# Patient Record
Sex: Female | Born: 1960 | Race: Black or African American | Hispanic: No | Marital: Single | Smoking: Current every day smoker
Health system: Southern US, Community
[De-identification: ages and names within clinical notes are randomized; demographics above are authoritative.]

## PROBLEM LIST (undated history)

## (undated) DIAGNOSIS — M5416 Radiculopathy, lumbar region: Secondary | ICD-10-CM

## (undated) DIAGNOSIS — R202 Paresthesia of skin: Secondary | ICD-10-CM

## (undated) DIAGNOSIS — M48061 Spinal stenosis, lumbar region without neurogenic claudication: Secondary | ICD-10-CM

## (undated) DIAGNOSIS — Z972 Presence of dental prosthetic device (complete) (partial): Secondary | ICD-10-CM

## (undated) DIAGNOSIS — Z716 Tobacco abuse counseling: Secondary | ICD-10-CM

## (undated) DIAGNOSIS — K5792 Diverticulitis of intestine, part unspecified, without perforation or abscess without bleeding: Secondary | ICD-10-CM

## (undated) DIAGNOSIS — K5732 Diverticulitis of large intestine without perforation or abscess without bleeding: Secondary | ICD-10-CM

## (undated) DIAGNOSIS — R200 Anesthesia of skin: Secondary | ICD-10-CM

## (undated) DIAGNOSIS — R739 Hyperglycemia, unspecified: Secondary | ICD-10-CM

## (undated) DIAGNOSIS — M1990 Unspecified osteoarthritis, unspecified site: Secondary | ICD-10-CM

## (undated) HISTORY — DX: Radiculopathy, lumbar region: M54.16

## (undated) HISTORY — DX: Tobacco abuse counseling: Z71.6

## (undated) HISTORY — DX: Paresthesia of skin: R20.2

## (undated) HISTORY — DX: Diverticulitis of large intestine without perforation or abscess without bleeding: K57.32

## (undated) HISTORY — DX: Spinal stenosis, lumbar region without neurogenic claudication: M48.061

## (undated) HISTORY — DX: Anesthesia of skin: R20.0

## (undated) HISTORY — DX: Hyperglycemia, unspecified: R73.9

---
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
## 1999-11-13 ENCOUNTER — Other Ambulatory Visit: Admission: RE | Admit: 1999-11-13 | Discharge: 1999-11-13 | Payer: Self-pay | Admitting: Obstetrics and Gynecology

## 2000-03-26 ENCOUNTER — Ambulatory Visit (HOSPITAL_COMMUNITY): Admission: RE | Admit: 2000-03-26 | Discharge: 2000-03-26 | Payer: Self-pay | Admitting: Obstetrics and Gynecology

## 2000-03-26 ENCOUNTER — Encounter: Payer: Self-pay | Admitting: Obstetrics and Gynecology

## 2000-04-08 ENCOUNTER — Encounter: Payer: Self-pay | Admitting: Obstetrics and Gynecology

## 2000-04-09 ENCOUNTER — Ambulatory Visit (HOSPITAL_COMMUNITY): Admission: RE | Admit: 2000-04-09 | Discharge: 2000-04-09 | Payer: Self-pay | Admitting: Obstetrics and Gynecology

## 2000-04-09 ENCOUNTER — Encounter (INDEPENDENT_AMBULATORY_CARE_PROVIDER_SITE_OTHER): Payer: Self-pay | Admitting: Specialist

## 2000-12-03 ENCOUNTER — Other Ambulatory Visit: Admission: RE | Admit: 2000-12-03 | Discharge: 2000-12-03 | Payer: Self-pay | Admitting: Obstetrics and Gynecology

## 2001-06-29 HISTORY — PX: ABLATION ON ENDOMETRIOSIS: SHX5787

## 2004-09-08 ENCOUNTER — Ambulatory Visit: Payer: Self-pay | Admitting: *Deleted

## 2004-09-08 ENCOUNTER — Observation Stay (HOSPITAL_COMMUNITY): Admission: AD | Admit: 2004-09-08 | Discharge: 2004-09-08 | Payer: Self-pay | Admitting: Internal Medicine

## 2007-10-06 ENCOUNTER — Emergency Department (HOSPITAL_COMMUNITY): Admission: EM | Admit: 2007-10-06 | Discharge: 2007-10-06 | Payer: Self-pay | Admitting: Emergency Medicine

## 2009-10-25 ENCOUNTER — Emergency Department (HOSPITAL_COMMUNITY): Admission: EM | Admit: 2009-10-25 | Discharge: 2009-10-26 | Payer: Self-pay | Admitting: Emergency Medicine

## 2010-09-16 LAB — POCT I-STAT, CHEM 8
BUN: 12 mg/dL (ref 6–23)
Calcium, Ion: 1.07 mmol/L — ABNORMAL LOW (ref 1.12–1.32)
Creatinine, Ser: 0.4 mg/dL (ref 0.4–1.2)
Glucose, Bld: 125 mg/dL — ABNORMAL HIGH (ref 70–99)
TCO2: 21 mmol/L (ref 0–100)

## 2010-09-16 LAB — CBC
HCT: 39.3 % (ref 36.0–46.0)
MCV: 77.1 fL — ABNORMAL LOW (ref 78.0–100.0)
RBC: 5.09 MIL/uL (ref 3.87–5.11)
WBC: 7.6 10*3/uL (ref 4.0–10.5)

## 2010-11-14 NOTE — Op Note (Signed)
[facility]  Patient:    Taylor Ray, Taylor Ray                   MRN: [id number] Proc. Date: 04/09/00 Adm. Date:  [id number] Attending:  Conley Simmonds A CC:         Brook A. Edward Jolly, M.D.   Operative Report  PREOPERATIVE DIAGNOSIS:  Anal condylomata.  POSTOPERATIVE DIAGNOSIS:  Anal condylomata.  PROCEDURE:  Laser and cautery destruction, anal condylomata.  SURGEON:  Timothy E. Earlene Plater, M.D.  ANESTHESIA:  General.  INDICATIONS:  This 50 year old black female was referred to me by Dr. Conley Simmonds who I was glad to see.  She had fairly extensive anal condylomata that had been present for some time and after careful discussion, she wished to have these treated at the time of her proposed laparoscopic surgery.  We have made those arrangements, and the patient is ready to proceed.  DESCRIPTION OF PROCEDURE:  The patient had been operated on by Dr. Edward Jolly. Please see her separately dictated note.  She was stable when I entered the room.  She was in lithotomy position.  The drapes were rearranged, and the feet were elevated so that the perianal area could be carefully visualized. Wet drapes were hung around the operative site.  The condylomata were extensive, extending out from the anal verge to the perianal skin.  The larger pedunculated lesions were removed by cautery and submitted for pathology.  The smaller lesions and the bases of all lesions were treated with the CO2 laser between 5 and 10 watts continuous power.  These were completely treated. There were no complications.  Dry sterile dressing was applied.  She tolerated it well and was removed to the recovery room in good condition.  Written instructions were given, and she will be followed as an outpatient. DD:  04/09/00 TD:  04/11/00 Job: [id number]

## 2010-11-14 NOTE — Discharge Summary (Signed)
Taylor Ray, Taylor Ray NO.:  [id number]   MEDICAL RECORD NO.:  [id number]          PATIENT TYPE:  INP   LOCATION:  4733                         FACILITY:  [facility]   PHYSICIAN:  Duke Salvia, M.D.  DATE OF BIRTH:  08-20-1960   DATE OF ADMISSION:  09/08/2004  DATE OF DISCHARGE:  09/08/2004                                 DISCHARGE SUMMARY   PROCEDURES:  1.  Cardiac catheterization.  2.  Coronary arteriogram.  3.  Left ventriculogram.   DISCHARGE DIAGNOSES:  1.  Chest pain, Cardiolite with anterior wall ischemia.  2.  Morbid obesity.  3.  Hyperlipidemia.  4.  History of tobacco use, quit one week ago.  5.  HISTORY OF ALLERGY OR INTOLERANCE TO PENICILLIN, HYDROCODONE AND      BENADRYL.  6.  Chronic pelvic pain secondary reportedly to ovarian cysts.  7.  Status post tonsillectomy.   HOSPITAL COURSE:  [name] is a 51 year old female without known coronary  artery disease.  She went to [facility] for chest pain and ruled out  there for an MI.  She was seen there by Dr. Sherril Croon and had an adenosine  Cardiolite.  The Cardiolite was positive for anterior wall ischemia.  She  was transferred to [facility] for further evaluation and  catheterization.   The cardiac catheterization showed no coronary artery disease.  Her EF was  60% with no wall motion abnormality and no MR.  Post cath she had no chest  pain or shortness of breath with ambulation.  Her vital signs were stable.  She was considered stable for discharge on September 08, 2004, and is to follow-  up as an outpatient with her primary care physician and with cardiology as  needed.   DISCHARGE MEDICATIONS:  1.  Lodine 600 mg daily p.r.n.  2.  Aleve p.r.n.  3.  Lipitor 40 mg daily.      RB/MEDQ  D:  09/08/2004  T:  09/08/2004  Job:  [id number]   cc:   Carole Binning, M.D. [facility]   Abner Greenspan, MD   Dhruv Vyas  [address]  [place]  [place] [address]  Fax: [phone]

## 2010-11-14 NOTE — Cardiovascular Report (Signed)
NAME:  Taylor Ray, Taylor Ray NO.:  [id number]   MEDICAL RECORD NO.:  [id number]          PATIENT TYPE:  INP   LOCATION:  4733                         FACILITY:  [facility]   PHYSICIAN:  Carole Binning, M.D. LHCDATE OF BIRTH:  1961-04-03   DATE OF PROCEDURE:  09/08/2004  DATE OF DISCHARGE:                              CARDIAC CATHETERIZATION   PROCEDURE PERFORMED:  Left heart catheterization with coronary angiography  and left ventriculography.   INDICATIONS:  [name] is a 50 year old woman with cardiac risk factors of  obesity and hyperlipidemia.  She was admitted to [facility] with  chest pain where she ruled out for myocardial infarction.  A stress nuclear  study suggested anterior ischemia.  The patient was transferred to [facility] for cardiac catheterization.   PROCEDURAL NOTE:  A 6-French sheath was placed in the right femoral artery.  Coronary angiography was performed with standard Judkins 6-French catheters.  Left ventriculography was performed with an angled pigtail catheter.  Contrast was Omnipaque.  At the conclusion of the procedure a 6-French  AngioSeal vascular closure device was placed in the right femoral artery  with good hemostasis.  There were no complications.   RESULTS:  HEMODYNAMICS:  Left ventricular pressure 114/20.  Aortic pressure 114/78.  There is no  aortic valve gradient.   LEFT VENTRICULOGRAM:  Wall motion is normal.  Ejection fraction estimated at 60%.  There is trace  mitral regurgitation.   CORONARY ARTERIOGRAPHY:  Left main is normal.   Left anterior descending artery has a 20% stenosis in the ostium.  The LAD  is normal with only minor luminal irregularities.  It gives rise to two  small diagonal branches.   Left circumflex gives rise to a small first obtuse marginal, large branching  second obtuse marginal, and four small posterolateral branches.  The left  circumflex is normal.   Right coronary artery is a  relatively small, somewhat codominant vessel.  It  gives rise to a normal sized posterior descending artery and a small  posterolateral branch.   IMPRESSION:  1.  Normal left ventricular systolic function.  2.  No significant coronary artery disease identified.   PLAN:  Patient will be managed medically.  Alternative etiologies for the  chest pain should be considered.      MWP/MEDQ  D:  09/08/2004  T:  09/08/2004  Job:  [id number]   cc:   Florian Buff, M.D.   Abner Greenspan, M.D.   Cath Lab

## 2010-11-14 NOTE — Op Note (Signed)
[facility]  Patient:    Taylor Ray, Taylor Ray                   MRN: [id number] Proc. Date: 04/09/00 Adm. Date:  [id number] Disc. Date: [id number] Attending:  Conley Simmonds A                           Operative Report  PREOPERATIVE DIAGNOSIS:  Chronic pelvic pain.  POSTOPERATIVE DIAGNOSIS:  Chronic pelvic pain, extensive pelvic adhesions.  PROCEDURE:  Diagnostic laparoscopy with lysis of adhesions and cul-de-sac peritoneal biopsy.  IV FLUIDS:  Ringers lactate, 1900 cc.  ESTIMATED BLOOD LOSS:  100 cc.  URINE OUTPUT:  100 cc.  COMPLICATIONS:  None.  INDICATIONS FOR PROCEDURE:  The patient was a 50 year old gravida 0 African-American female with a history of chronic pelvic pain of eight months duration.  The patient reported pain between the vagina and the rectum and also located in the left lower quadrant.  The patient was treated for pelvic inflammatory disease on two occasions, and her gonorrhea and chlamydia cultures were negative.  The patient reports that oral contraceptives did somewhat diminish her pelvic pain, but the contraceptives were discontinued secondary to her use of tobacco.  Nonsteroidal anti-inflammatory medications and Micronor were somewhat helpful in treating the pain.  The patient was status post colonoscopy and an upper endoscopy in 1999 which were both normal. The patient had a pelvic ultrasound on March 04, 2000, at which time, the 2-cm simple cyst was noted of each ovary.  The uterus was unremarkable.  The patient also underwent a preoperative hysterosalpingogram which documented a normal fill and contrast of the uterine cavity along with normal fill-and-spill of the fallopian tubes bilaterally.  The patient wished for surgical evaluation and treatment of her chronic pelvic pain, and she agreed to her procedure after the risks, benefits, and alternatives were reviewed with her.  FINDINGS:  Laparoscopy demonstrated  extensive thickened adhesions between the omentum and the left adnexa and thickened omental adhesions to the right adnexa.  The omentum was also adherent to the posterior cul-de-sac and completely obliterated it.  There was evidence of a right corpus luteum cyst, and the ovaries were noted to be normal.  The fallopian tubes appeared to be clubbed bilaterally.  There was no evidence of any obvious endometriosis in the pelvis.  The uterus was noted to be normal as was the appendix and the liver and stomach.  There was no evidence of any upper abdominal adhesions.  SPECIMENS:  A biopsy was taken from the posterior cul-de-sac.  DESCRIPTION OF PROCEDURE:  With an IV in place, the patient was taken to the operating room after she was properly identified.  The patient was given clindamycin 900 mg intravenously for antibiotic prophylaxis.  The patient was placed in the supine position, and general endotracheal anesthesia was induced.  The patient was then placed in the dorsolithotomy position, and her abdomen and vagina were sterilely prepped and draped.  A Foley catheter was sterilely placed inside the bladder, and a Hulka tenaculum was placed on the anterior cervical lips.  The procedure began with an umbilical incision which was created sharply with a scalpel.  This was carried down to the fascia with blunt dissection.  A 10-mm trocar was then inserted directly into the abdominal cavity, and the laparoscope confirmed proper placement.  A pneumoperitoneum was achieved.  The patient was then placed in the Trendelenburg position.  A 5-mm  trocar was placed beneath the right and left lower quadrants under direct visualization of the laparoscope.  There was some bleeding noted along the right lower quadrant incision prior to placement of the trocar; this was cauterized with monopolar cautery.  There was no evidence of any bleeding from the trocar site noted intraperitoneally.  An exploration of  the abdomen and pelvis was performed.  The findings were noted above.  The procedure began with bipolar cautery to the omental adhesion to the left adnexa.  The cautery was performed well away from the fallopian tube.  The omentum was dissected away from the adnexa using a combination of sharp and blunt dissection.  The omentum was freed 100% from the adnexa on this side.  The same procedure that was performed on the patients left-hand side was then repeated along the right adnexal region.  Again, care was taken to avoid contact with the fallopian tube during the cauterization process.  The omental adhesions were then dissected free from the posterior cul-de-sac. The omentum achieved 100% mobility from this region.  A peritoneal biopsy was then performed in the posterior cul-de-sac using sharp dissection with a scissors.  The edges of the peritoneum and the underlying fat were cauterized with monopolar cautery for excellent hemostasis.  The abdomen was then irrigated and suctioned with crystalloid solution, and the operative sites were re-examined.  There was some additional bleeding noted along the omentum which responded well to bipolar cautery.  The pneumoperitoneum was then partially released, and the operative sites were examined.  There was no evidence of any ongoing bleeding noted.  Intercede was then introduced into the peritoneal cavity and wrapped around the adnexae bilaterally.  The lower abdominal trocars were then removed under visualization of the laparoscope, and the pneumoperitoneum was released.  The laparoscope and the 10-mm umbilical trocar were removed simultaneously.  The skin incisions were closed with subcuticular sutures of 4-0 Vicryl.  The patients abdomen was cleansed with remaining Betadine, and Steri-Strips and bandages were placed over the incisions.  The Hulka tenaculum was then removed from the vagina.  At this point, the surgery was turned over to Dr.  Lorelee New who performed laser ablation of perirectal condylomata.  Please refer to that dictation separately. DD:  04/09/00 TD:  04/11/00 Job: [id number]

## 2010-11-14 NOTE — H&P (Signed)
Taylor Ray, Taylor Ray NO.:  [id number]   MEDICAL RECORD NO.:  [id number]          PATIENT TYPE:  INP   LOCATION:  4733                         FACILITY:  [facility]   PHYSICIAN:  Verne Grain, MD   DATE OF BIRTH:  [birth year]   DATE OF ADMISSION:  09/08/2004  DATE OF DISCHARGE:                                HISTORY & PHYSICAL   PRIMARY CARDIOLOGIST:  None.   PRIMARY CARE PHYSICIAN:  Dr. Abner Greenspan at [facility] in  [place]   CHIEF COMPLAINT:  Chest pain with positive adenosine Cardiolite (anterior  wall ischemia).  Transferred from [facility] for cardiac  catheterization.   HISTORY OF PRESENT ILLNESS:  A 50 year old female morbidly obese,  hyperlipidemia, tobacco (quit last week).  Noted occasional chest pain in  the evenings lasting one minute or less over the preceding several months.  No clear aggravating, relieving, or associated factors.  Also would  occasionally have chest tightness with ambulation.  However, this was not  reproducible and was only present on a couple of occasions with symptoms  again lasting a minute or less.  However, on the date of September 04, 2004  patient woke up 3 a.m. with a 7/10 chest pain in the upper chest region with  no radiation, no nausea, no vomiting, no diaphoresis, no shortness of  breath.  The discomfort waxed and waned from 12 hours going from a 7/10  severity to 0/10 and back to a 7/10.  She presented to the emergency room  with serial cardiac markers that were negative.  She had a two-day protocol  adenosine Cardiolite which revealed evidence of anterior ischemia.  The  patient was subsequently sent to [facility] for cardiac catheterization as  follow-up to this abnormal nuclear perfusion study.  Patient has had no  chest pain or shortness of breath since her initial test.   PAST MEDICAL HISTORY:  1.  Hyperlipidemia.  2.  Morbid obesity.  3.  Tobacco use (quit one week ago).  4.  History of  chronic pelvic pain/ovarian cysts.   ALLERGIES/ADVERSE REACTIONS:  PENICILLIN had some reaction in childhood for  which the patient could not recall, HYDROCODONE causes a rash, BENADRYL  causes agitation.   HOME MEDICATIONS:  1.  Lodine XL 600 mg p.r.n.  2.  Microgestin Fe daily.   [place] TRANSFER MEDICATIONS:  1.  Aspirin 81 mg p.o. daily.  2.  Protonix 40 mg p.o. daily.  3.  Lovenox 140 mg subcutaneous q.12h.  4.  Lipitor 40 mg p.o. q.h.s.  5.  Metoprolol 25 mg p.o. b.i.d.  6.  p.r.n.'s.   SOCIAL HISTORY:  The patient lives in [place] alone.  She is a CNA/med  tech.  She has no children.  She is not married.  She had smoked  approximately one pack of cigarettes every three days, but reports quitting  last Monday.  She drinks no alcohol on any regular basis and denies any  illicit drug use.  She has no regular exercise regimen, but reports being  active doing housework and in her job.   FAMILY HISTORY:  The patient's mother is alive at age 79 with hypertension,  hyperlipidemia, and knee replacement.  Patient's father is alive at age 51  with hypertension.  Patient has one sister with diabetes.  Three brothers,  two of whom are healthy, one of whom died of sickle cell disease.   REVIEW OF SYSTEMS:  No fevers, chills, sweats, cough.  No acute auditory or  visual changes.  No acute rash.  The patient does report chest pain as  described above, but denies any shortness of breath, dyspnea on exertion,  orthopnea, PND, edema, palpitations, presyncope, claudication, cough, or  wheezing.  Frequency.  The patient has no bowel or bladder complaints.  No  focal weakness or numbness.  She did have some itching after her Cardiolite  examination but that has spontaneously resolved.  The etiology of itching  was unclear.  She reports no nausea, vomiting, diarrhea.  No polyuria,  polydipsia.  No heat or cold intolerance.  No hair changes or other  abnormality suggestive of endocrinologic  derangement.   PHYSICAL EXAMINATION:  VITAL SIGNS:  Temperature 97.8, heart rate 83,  respiratory rate 20, blood pressure 124/83, weight 307.3 pounds.  GENERAL:  Patient is pleasant, cooperative, morbidly obese.  HEENT:  Normocephalic, atraumatic.  Extraocular movements are intact.  Oropharynx is pink and moist without lesions.  NECK:  Supple.  There is no jugular venous distention.  There are no carotid  bruits.  CARDIOVASCULAR:  Regular S1 and regular S2.  Lung fields are clear to  auscultation bilaterally.  There is no acute rash identified.  ABDOMEN:  Obese, soft, nontender, nondistended with positive bowel sounds.  EXTREMITIES:  No evidence of edema.  No femoral bruits.  Patient is alert  and oriented.  She moves all four extremities without difficulty and has a  grossly nonfocal neurological examination.   [place] chest x-ray reported as no acute abnormality.   EKG:  Sinus rhythm with normal PR, QRS, and QTC intervals.  Axis is leftward  with poor R-wave progression and low anterior forces possibly related to  lead placement versus other etiology.   LABORATORIES:  White blood cell count 6.7, hematocrit 37, platelets 303.  Sodium 142, potassium 4.5, chloride 109, bicarbonate 25, anion gap 8, BUN  12, creatinine 0.9, glucose 92, total bilirubin 0.54, AST 27, ALT 23,  alkaline phosphatase 108.  CK 159, CK-MB 1.1.  Albumin 3.8, total protein  7.9.  Troponin 0.00.  Total cholesterol 233, LDL 156, triglycerides 79.  Serial troponin and CK-MB negative x3.  Calcium 9.2.   ASSESSMENT/PLAN:  A 50 year old female morbid obesity, hyperlipidemia,  tobacco (quit cigarettes last week) with chest pain, mostly atypical  symptoms.  However, adenosine sestamibi performed at [facility] was  abnormal, reportedly revealing evidence of anterior ischemia prompting  transfer to [facility] for follow-up catheterization. 1.  Chest pain with abnormal nuclear perfusion imaging study.  Will make  the      patient n.p.o. except for medications in preparation for cardiac      catheterization.  Will hold Lovenox in preparation for heart      catheterization.  Will continue aspirin, metoprolol, Lipitor.  Will      continue to encourage tobacco cessation and weight loss throughout this      hospitalization.  2.  Hyperlipidemia.  Patient's LDL was 156 at [facility].  She is      currently being treated with Lipitor 40 mg p.o. q.h.s. with LFTs that      are  normal at baseline.  3.  Tobacco.  I will continue to encourage the importance of cessation.  4.  Morbid obesity.  Will check a hemoglobin A1C.  It is notable that the      patient has positive family history of diabetes mellitus.  Therefore,      she is clearly at risk with her increased weight and positive family      history of developing insulin resistance.  Following discharge will      strongly encourage regular exercise regimen, diet, weight loss.  Will      also check a TSH and free T4 to look for other contributing factors to      patient's morbid obesity.  5.  Gastrointestinal.  It is certainly possible, with all the patient's      symptoms at night upper chest pain that she could have gastric reflux.      We will treat her empirically with a proton-pump inhibitor therapy while      she awaits cardiac catheterization therapy to exclude coronary artery      disease.      DDH/MEDQ  D:  09/08/2004  T:  09/08/2004  Job:  [id number]

## 2011-03-24 LAB — POCT I-STAT, CHEM 8
Calcium, Ion: 1.15
Chloride: 110
Glucose, Bld: 94
HCT: 39

## 2011-03-24 LAB — DIFFERENTIAL
Basophils Relative: 0
Eosinophils Absolute: 0.1
Monocytes Absolute: 0.4
Monocytes Relative: 7
Neutro Abs: 4

## 2011-03-24 LAB — POCT CARDIAC MARKERS: CKMB, poc: 1.6

## 2011-03-24 LAB — CBC
Hemoglobin: 12.1
MCHC: 32.7
MCV: 76.1 — ABNORMAL LOW
RBC: 4.86

## 2016-09-25 ENCOUNTER — Emergency Department
Admission: EM | Admit: 2016-09-25 | Discharge: 2016-09-25 | Disposition: A | Discharge status: Home / Self Care | Payer: Self-pay | Attending: Emergency Medicine | Admitting: Emergency Medicine

## 2016-09-25 DIAGNOSIS — Z87891 Personal history of nicotine dependence: Secondary | ICD-10-CM | POA: Insufficient documentation

## 2016-09-25 DIAGNOSIS — Z5321 Procedure and treatment not carried out due to patient leaving prior to being seen by health care provider: Secondary | ICD-10-CM | POA: Insufficient documentation

## 2016-09-25 DIAGNOSIS — K5732 Diverticulitis of large intestine without perforation or abscess without bleeding: Secondary | ICD-10-CM | POA: Insufficient documentation

## 2016-09-25 DIAGNOSIS — R109 Unspecified abdominal pain: Secondary | ICD-10-CM | POA: Insufficient documentation

## 2016-09-25 LAB — COMPREHENSIVE METABOLIC PANEL
ALT: 11 U/L — ABNORMAL LOW (ref 14–54)
ANION GAP: 7 (ref 5–15)
AST: 16 U/L (ref 15–41)
Albumin: 3.7 g/dL (ref 3.5–5.0)
Alkaline Phosphatase: 89 U/L (ref 38–126)
BUN: 12 mg/dL (ref 6–20)
CHLORIDE: 109 mmol/L (ref 101–111)
CO2: 25 mmol/L (ref 22–32)
Calcium: 8.9 mg/dL (ref 8.9–10.3)
Creatinine, Ser: 0.55 mg/dL (ref 0.44–1.00)
Glucose, Bld: 101 mg/dL — ABNORMAL HIGH (ref 65–99)
Potassium: 3.7 mmol/L (ref 3.5–5.1)
SODIUM: 141 mmol/L (ref 135–145)
Total Bilirubin: 0.8 mg/dL (ref 0.3–1.2)
Total Protein: 6.9 g/dL (ref 6.5–8.1)

## 2016-09-25 LAB — CBC
HCT: 36.4 % (ref 35.0–47.0)
HEMOGLOBIN: 12 g/dL (ref 12.0–16.0)
MCH: 25.2 pg — ABNORMAL LOW (ref 26.0–34.0)
MCHC: 32.9 g/dL (ref 32.0–36.0)
MCV: 76.5 fL — AB (ref 80.0–100.0)
Platelets: 213 10*3/uL (ref 150–440)
RBC: 4.76 MIL/uL (ref 3.80–5.20)
RDW: 16.3 % — ABNORMAL HIGH (ref 11.5–14.5)
WBC: 7.4 10*3/uL (ref 3.6–11.0)

## 2016-09-25 LAB — URINALYSIS, COMPLETE (UACMP) WITH MICROSCOPIC
BILIRUBIN URINE: NEGATIVE
Bacteria, UA: NONE SEEN
GLUCOSE, UA: NEGATIVE mg/dL
Hgb urine dipstick: NEGATIVE
KETONES UR: NEGATIVE mg/dL
LEUKOCYTES UA: NEGATIVE
Nitrite: NEGATIVE
PH: 5 (ref 5.0–8.0)
PROTEIN: NEGATIVE mg/dL
Specific Gravity, Urine: 1.027 (ref 1.005–1.030)

## 2016-09-25 LAB — LIPASE, BLOOD: LIPASE: 19 U/L (ref 11–51)

## 2016-09-25 MED ORDER — ACETAMINOPHEN 325 MG PO TABS
ORAL_TABLET | ORAL | Status: AC
  Filled 2016-09-25: qty 2

## 2016-09-25 MED ORDER — ACETAMINOPHEN 325 MG PO TABS
650.0000 mg | ORAL_TABLET | Freq: Once | ORAL | Status: AC
  Administered 2016-09-25: 650 mg via ORAL

## 2016-09-25 MED ORDER — CIPROFLOXACIN HCL 500 MG PO TABS
ORAL_TABLET | ORAL | Status: AC
  Filled 2016-09-25: qty 1

## 2016-09-25 MED ORDER — METRONIDAZOLE 500 MG PO TABS
500.0000 mg | ORAL_TABLET | Freq: Once | ORAL | Status: AC
  Administered 2016-09-25: 500 mg via ORAL

## 2016-09-25 MED ORDER — METRONIDAZOLE 500 MG PO TABS: 500.0000 mg | ORAL_TABLET | Freq: Two times a day (BID) | ORAL | 0 refills | Status: DC

## 2016-09-25 MED ORDER — METRONIDAZOLE 500 MG PO TABS
ORAL_TABLET | ORAL | Status: AC
  Filled 2016-09-25: qty 1

## 2016-09-25 MED ORDER — CIPROFLOXACIN HCL 500 MG PO TABS
500.0000 mg | ORAL_TABLET | Freq: Once | ORAL | Status: AC
  Administered 2016-09-25: 500 mg via ORAL

## 2016-09-25 MED ORDER — CIPROFLOXACIN HCL 500 MG PO TABS: 500.0000 mg | ORAL_TABLET | Freq: Two times a day (BID) | ORAL | 0 refills | Status: DC

## 2016-09-25 MED ORDER — TRAMADOL HCL 50 MG PO TABS: 50.0000 mg | ORAL_TABLET | Freq: Four times a day (QID) | ORAL | 0 refills | Status: DC | PRN

## 2016-09-25 NOTE — ED Triage Notes (Addendum)
Pt reports to ED w/ c/o abd pain x 7 days. Sts n/v started today.  +PO fluid intake. Resp even and unlabored, skin warm and dry, ambulatory to triage w/o issue

## 2016-09-25 NOTE — ED Triage Notes (Signed)
Pt had left ED to "pick up client", had to be reregistered.  Pt complaints remains abd pain x 7 days w/ nv that began today.  +PO fluid intake.  NAD

## 2016-09-25 NOTE — ED Provider Notes (Signed)
[facility] Emergency Department Provider Note   ____________________________________________    I have reviewed the triage vital signs and the nursing notes.   HISTORY  Chief Complaint Abdominal Pain     HPI Taylor Ray is a 56 y.o. female who presents with complaints of abdominal pain. Patient describes left lower quadrant abdominal pain over the last week which has been "crampy" in nature and mild to moderate. She has had mild nausea but no vomiting, no diarrhea, normal stools. She does have a history of diverticulitis and this feels similar. No fevers or chills. No dysuria   History reviewed. No pertinent past medical history.  There are no active problems to display for this patient.   Past Surgical History:  Procedure Laterality Date  . ABLATION ON ENDOMETRIOSIS    . TONSILLECTOMY      Prior to Admission medications   Medication Sig Start Date End Date Taking? Authorizing Provider  ciprofloxacin (CIPRO) 500 MG tablet Take 1 tablet (500 mg total) by mouth 2 (two) times daily. 09/25/16   Lavonia Drafts, MD  metroNIDAZOLE (FLAGYL) 500 MG tablet Take 1 tablet (500 mg total) by mouth 2 (two) times daily after a meal. 09/25/16   Lavonia Drafts, MD  traMADol (ULTRAM) 50 MG tablet Take 1 tablet (50 mg total) by mouth every 6 (six) hours as needed. 09/25/16 09/25/17  Lavonia Drafts, MD     Allergies Penicillins and Benadryl [diphenhydramine hcl]  No family history on file.  Social History Social History  Substance Use Topics  . Smoking status: Former Research scientist (life sciences)  . Smokeless tobacco: Never Used  . Alcohol use Not on file    Review of Systems  Constitutional: No fever/chills Eyes: No visual changes.   Cardiovascular: Denies chest pain. Respiratory: Denies shortness of breath. Gastrointestinal: As above Genitourinary: Negative for dysuria. Musculoskeletal: Negative for back pain. Skin: Negative for rash.   10-point ROS otherwise  negative.  ____________________________________________   PHYSICAL EXAM:  VITAL SIGNS: ED Triage Vitals  Enc Vitals Group     BP 09/25/16 2008 130/75     Pulse Rate 09/25/16 2008 70     Resp 09/25/16 2008 18     Temp 09/25/16 2008 98.3 F (36.8 C)     Temp Source 09/25/16 2008 Oral     SpO2 09/25/16 2008 98 %     Weight 09/25/16 2007 258 lb (117 kg)     Height 09/25/16 2007 5\' 4"  (1.626 m)     Head Circumference --      Peak Flow --      Pain Score 09/25/16 2007 7     Pain Loc --      Pain Edu? --      Excl. in [place]? --     Constitutional: Alert and oriented. No acute distress. Pleasant and interactive  Nose: No congestion/rhinnorhea. Mouth/Throat: Mucous membranes are moist.    Cardiovascular: Normal rate, regular rhythm. Grossly normal heart sounds.  Good peripheral circulation. Respiratory: Normal respiratory effort.  No retractions. Lungs CTAB. Gastrointestinal: Mild tenderness to palpation left lower quadrant. No distention.  No CVA tenderness. Genitourinary: deferred Musculoskeletal: No lower extremity tenderness nor edema.  Warm and well perfused Neurologic:  Normal speech and language. No gross focal neurologic deficits are appreciated.  Skin:  Skin is warm, dry and intact. No rash noted. Psychiatric: Mood and affect are normal. Speech and behavior are normal.  ____________________________________________   LABS (all labs ordered are listed, but only abnormal results  are displayed)  Labs Reviewed - No data to display ____________________________________________  EKG  None ____________________________________________  RADIOLOGY  None ____________________________________________   PROCEDURES  Procedure(s) performed: No    Critical Care performedNo ____________________________________________   INITIAL IMPRESSION / ASSESSMENT AND PLAN / ED COURSE  Pertinent labs & imaging results that were available during my care of the patient were reviewed  by me and considered in my medical decision making (see chart for details).  Patient overall well-appearing and in no acute distress. Vital signs are reassuring. Lab work is benign. Mild tenderness to palpation left lower quadrant. Strongly suspect diverticulitis. Discussed with patient the option for imaging but she declined as she would like to try antibiotics to avoid a CT scan. I feel this is reasonable given her reassuring exam and presentation. She knows to return immediately if worsening condition or worsening pain.    ____________________________________________   FINAL CLINICAL IMPRESSION(S) / ED DIAGNOSES  Final diagnoses:  Diverticulitis of large intestine without perforation or abscess without bleeding      NEW MEDICATIONS STARTED DURING THIS VISIT:  Discharge Medication List as of 09/25/2016  8:53 PM    START taking these medications   Details  ciprofloxacin (CIPRO) 500 MG tablet Take 1 tablet (500 mg total) by mouth 2 (two) times daily., Starting Fri 09/25/2016, Print    metroNIDAZOLE (FLAGYL) 500 MG tablet Take 1 tablet (500 mg total) by mouth 2 (two) times daily after a meal., Starting Fri 09/25/2016, Print    traMADol (ULTRAM) 50 MG tablet Take 1 tablet (50 mg total) by mouth every 6 (six) hours as needed., Starting Fri 09/25/2016, Until Sat 09/25/2017, Print         Note:  This document was prepared using Dragon voice recognition software and may include unintentional dictation errors.    Lavonia Drafts, MD 09/25/16 2122

## 2016-09-25 NOTE — ED Notes (Signed)
Pt reports that she is having left sided abd pain for the last 7 day - reports N/V but denies diarrhea - vomited x6 in 24 hours - pt reports she has had a perforated intestine before

## 2016-09-25 NOTE — ED Notes (Signed)
Called to room No answer

## 2016-10-01 ENCOUNTER — Emergency Department
Admission: EM | Admit: 2016-10-01 | Discharge: 2016-10-01 | Disposition: A | Discharge status: Home / Self Care | Payer: Self-pay | Attending: Emergency Medicine | Admitting: Emergency Medicine

## 2016-10-01 ENCOUNTER — Encounter: Payer: Self-pay | Admitting: Emergency Medicine

## 2016-10-01 ENCOUNTER — Emergency Department: Payer: Self-pay

## 2016-10-01 DIAGNOSIS — K5720 Diverticulitis of large intestine with perforation and abscess without bleeding: Secondary | ICD-10-CM | POA: Insufficient documentation

## 2016-10-01 DIAGNOSIS — Z79899 Other long term (current) drug therapy: Secondary | ICD-10-CM | POA: Insufficient documentation

## 2016-10-01 DIAGNOSIS — Z87891 Personal history of nicotine dependence: Secondary | ICD-10-CM | POA: Insufficient documentation

## 2016-10-01 LAB — COMPREHENSIVE METABOLIC PANEL
ALT: 10 U/L — AB (ref 14–54)
AST: 16 U/L (ref 15–41)
Albumin: 3.6 g/dL (ref 3.5–5.0)
Alkaline Phosphatase: 78 U/L (ref 38–126)
Anion gap: 9 (ref 5–15)
BUN: 11 mg/dL (ref 6–20)
CHLORIDE: 105 mmol/L (ref 101–111)
CO2: 25 mmol/L (ref 22–32)
CREATININE: 0.69 mg/dL (ref 0.44–1.00)
Calcium: 9 mg/dL (ref 8.9–10.3)
GFR calc Af Amer: >60 mL/min (ref >60)
GFR calc non Af Amer: >60 mL/min (ref >60)
Glucose, Bld: 96 mg/dL (ref 65–99)
Potassium: 4.1 mmol/L (ref 3.5–5.1)
Sodium: 139 mmol/L (ref 135–145)
Total Bilirubin: 0.5 mg/dL (ref 0.3–1.2)
Total Protein: 7.4 g/dL (ref 6.5–8.1)

## 2016-10-01 LAB — URINALYSIS, COMPLETE (UACMP) WITH MICROSCOPIC
BILIRUBIN URINE: NEGATIVE
Glucose, UA: NEGATIVE mg/dL
Hgb urine dipstick: NEGATIVE
KETONES UR: 5 mg/dL — AB
LEUKOCYTES UA: NEGATIVE
Nitrite: NEGATIVE
PH: 5 (ref 5.0–8.0)
PROTEIN: NEGATIVE mg/dL
Specific Gravity, Urine: 1.018 (ref 1.005–1.030)

## 2016-10-01 LAB — LIPASE, BLOOD: LIPASE: 16 U/L (ref 11–51)

## 2016-10-01 LAB — CBC
HCT: 42 % (ref 35.0–47.0)
Hemoglobin: 13.1 g/dL (ref 12.0–16.0)
MCH: 24.4 pg — AB (ref 26.0–34.0)
MCHC: 31.2 g/dL — ABNORMAL LOW (ref 32.0–36.0)
MCV: 78.2 fL — AB (ref 80.0–100.0)
PLATELETS: 231 10*3/uL (ref 150–440)
RBC: 5.38 MIL/uL — AB (ref 3.80–5.20)
RDW: 16 % — AB (ref 11.5–14.5)
WBC: 6 10*3/uL (ref 3.6–11.0)

## 2016-10-01 MED ORDER — MORPHINE SULFATE (PF) 4 MG/ML IV SOLN
4.0000 mg | Freq: Once | INTRAVENOUS | Status: AC
  Administered 2016-10-01: 4 mg via INTRAVENOUS
  Filled 2016-10-01: qty 1

## 2016-10-01 MED ORDER — AMOXICILLIN-POT CLAVULANATE 875-125 MG PO TABS: 1.0000 | ORAL_TABLET | Freq: Two times a day (BID) | ORAL | 0 refills | Status: AC

## 2016-10-01 MED ORDER — OXYCODONE-ACETAMINOPHEN 7.5-325 MG PO TABS: 1.0000 | ORAL_TABLET | ORAL | 0 refills | Status: DC | PRN

## 2016-10-01 MED ORDER — PREDNISONE 10 MG (21) PO TBPK: ORAL_TABLET | ORAL | 0 refills | Status: DC

## 2016-10-01 MED ORDER — IOPAMIDOL (ISOVUE-300) INJECTION 61%
30.0000 mL | Freq: Once | INTRAVENOUS | Status: AC | PRN
  Administered 2016-10-01: 30 mL via ORAL
  Filled 2016-10-01: qty 30

## 2016-10-01 MED ORDER — ONDANSETRON HCL 4 MG/2ML IJ SOLN
4.0000 mg | Freq: Once | INTRAMUSCULAR | Status: AC
  Administered 2016-10-01: 4 mg via INTRAVENOUS
  Filled 2016-10-01: qty 2

## 2016-10-01 MED ORDER — IOPAMIDOL (ISOVUE-300) INJECTION 61%
100.0000 mL | Freq: Once | INTRAVENOUS | Status: AC | PRN
  Administered 2016-10-01: 100 mL via INTRAVENOUS
  Filled 2016-10-01: qty 100

## 2016-10-01 NOTE — ED Triage Notes (Signed)
Patient presents to ED via POV with c/o abdominal pain. Patient states she was here last Friday for same and was told if she didn't feel better to come back. Patient c/o nausea but denies vomiting.

## 2016-10-01 NOTE — ED Provider Notes (Signed)
[facility] Emergency Department Provider Note       Time seen: ----------------------------------------- 4:21 PM on 10/01/2016 -----------------------------------------     I have reviewed the triage vital signs and the nursing notes.   HISTORY   Chief Complaint Abdominal Pain    HPI Taylor Ray is a 56 y.o. female who presents to the ED for abdominal pain. Patient states she was seen here last Friday for abdominal pain and was told to come back if she didn't feel any better. Patient does have a history of complicated diverticulitis. She denies fevers, chills, has had nausea but noted vomiting. Pain is in the left lower quadrant, nothing makes it better or worse. She's been taking antibiotics as prescribed.   History reviewed. No pertinent past medical history.  There are no active problems to display for this patient.   Past Surgical History:  Procedure Laterality Date  . ABLATION ON ENDOMETRIOSIS    . TONSILLECTOMY      Allergies Penicillins and Benadryl [diphenhydramine hcl]  Social History Social History  Substance Use Topics  . Smoking status: Former Research scientist (life sciences)  . Smokeless tobacco: Never Used  . Alcohol use Not on file    Review of Systems Constitutional: Negative for fever. Cardiovascular: Negative for chest pain. Respiratory: Negative for shortness of breath. Gastrointestinal: Positive for abdominal pain Genitourinary: Negative for dysuria. Musculoskeletal: Negative for back pain. Skin: Negative for rash. Neurological: Negative for headaches, focal weakness or numbness.  10-point ROS otherwise negative.  ____________________________________________   PHYSICAL EXAM:  VITAL SIGNS: ED Triage Vitals [10/01/16 1521]  Enc Vitals Group     BP      Pulse      Resp      Temp      Temp src      SpO2      Weight 260 lb (117.9 kg)     Height 5\' 4"  (1.626 m)     Head Circumference      Peak Flow      Pain Score 9   Pain Loc      Pain Edu?      Excl. in [place]?     Constitutional: Alert and oriented. Well appearing and in no distress. Eyes: Conjunctivae are normal. PERRL. Normal extraocular movements. ENT   Head: Normocephalic and atraumatic.   Nose: No congestion/rhinnorhea.   Mouth/Throat: Mucous membranes are moist.   Neck: No stridor. Cardiovascular: Normal rate, regular rhythm. No murmurs, rubs, or gallops. Respiratory: Normal respiratory effort without tachypnea nor retractions. Breath sounds are clear and equal bilaterally. No wheezes/rales/rhonchi. Gastrointestinal: Left lower quadrant tenderness, no rebound or guarding. Normal bowel sounds. Musculoskeletal: Nontender with normal range of motion in extremities. No lower extremity tenderness nor edema. Neurologic:  Normal speech and language. No gross focal neurologic deficits are appreciated.  Skin:  Skin is warm, dry and intact. No rash noted. Psychiatric: Mood and affect are normal. Speech and behavior are normal.  _____________________________________________  ED COURSE:  Pertinent labs & imaging results that were available during my care of the patient were reviewed by me and considered in my medical decision making (see chart for details). Patient presents for abdominal pain, we will assess with labs and imaging as indicated.   Procedures ____________________________________________   LABS (pertinent positives/negatives)  Labs Reviewed  COMPREHENSIVE METABOLIC PANEL - Abnormal; Notable for the following:       Result Value   ALT 10 (*)    All other components within normal limits  CBC -  Abnormal; Notable for the following:    RBC 5.38 (*)    MCV 78.2 (*)    MCH 24.4 (*)    MCHC 31.2 (*)    RDW 16.0 (*)    All other components within normal limits  LIPASE, BLOOD  URINALYSIS, COMPLETE (UACMP) WITH MICROSCOPIC    RADIOLOGY Images were viewed by me  CT of the abdomen and pelvis with contrast IMPRESSION: 1.  Acute sigmoid colon diverticulitis. 1.8 cm collection along the wall of the sigmoid colon suggests a small intramural abscess. 2. Mild aortic atherosclerosis.  ____________________________________________  FINAL ASSESSMENT AND PLAN  Diverticulitis, possible intramural abscess  Plan: Patient's labs and imaging were dictated above. Patient had presented for left lower quadrant pain secondary to diverticulitis. She has failed outpatient treatment with Cipro and Flagyl. I will discuss with general surgeon for admission.   Earleen Newport, MD   Note: This note was generated in part or whole with voice recognition software. Voice recognition is usually quite accurate but there are transcription errors that can and very often do occur. I apologize for any typographical errors that were not detected and corrected.     Earleen Newport, MD 10/01/16 [phone]

## 2016-10-01 NOTE — ED Notes (Signed)
Patient completed contrast, CT called

## 2016-10-01 NOTE — Progress Notes (Signed)
  Called by ER to evaluate patient with diverticulitis that is not improving on outpatient therapy.  Patient does not wish to come in to the hospital. Mildly tender to palpation left lower quadrant on exam.  Discussed at length with the patient that as long as she can maintain hydration it is okay for her to attempt continued outpatient therapy. May need to adjust her oral antibiotics. This was difficult given her penicillin allergy.  Should she become febrile, worsen, failed to maintain hydration she'll need to be admitted to the hospital. His cuspid the patient that I don't care which hospital with this as long as she seeks appropriate medical care.  Patient voiced understanding.  Surgery will be available should patient required admission.  Clayburn Pert, MD [facility]  [facility] [phone] [facility] [phone]

## 2016-10-20 ENCOUNTER — Telehealth: Payer: Self-pay | Admitting: General Practice

## 2016-10-20 NOTE — Telephone Encounter (Signed)
Spoke with nurse Herb Grays and was told to place patient on schedule to follow up from ER visit.  Left a message on patients voicemail to call the office to make her an appointment to come in for a follow up visit.

## 2016-10-26 ENCOUNTER — Encounter: Payer: Self-pay | Admitting: Emergency Medicine

## 2016-10-26 ENCOUNTER — Emergency Department: Payer: Self-pay

## 2016-10-26 ENCOUNTER — Inpatient Hospital Stay
Admission: EM | Admit: 2016-10-26 | Discharge: 2016-10-29 | DRG: 392 | Disposition: A | Discharge status: Home / Self Care | Payer: Self-pay | Attending: Internal Medicine | Admitting: Internal Medicine

## 2016-10-26 DIAGNOSIS — E785 Hyperlipidemia, unspecified: Secondary | ICD-10-CM | POA: Diagnosis present

## 2016-10-26 DIAGNOSIS — Z8249 Family history of ischemic heart disease and other diseases of the circulatory system: Secondary | ICD-10-CM

## 2016-10-26 DIAGNOSIS — Z6841 Body Mass Index (BMI) 40.0 and over, adult: Secondary | ICD-10-CM

## 2016-10-26 DIAGNOSIS — R202 Paresthesia of skin: Secondary | ICD-10-CM

## 2016-10-26 DIAGNOSIS — R030 Elevated blood-pressure reading, without diagnosis of hypertension: Secondary | ICD-10-CM | POA: Diagnosis present

## 2016-10-26 DIAGNOSIS — Z716 Tobacco abuse counseling: Secondary | ICD-10-CM

## 2016-10-26 DIAGNOSIS — M5416 Radiculopathy, lumbar region: Secondary | ICD-10-CM

## 2016-10-26 DIAGNOSIS — K5732 Diverticulitis of large intestine without perforation or abscess without bleeding: Secondary | ICD-10-CM

## 2016-10-26 DIAGNOSIS — M4726 Other spondylosis with radiculopathy, lumbar region: Secondary | ICD-10-CM | POA: Diagnosis present

## 2016-10-26 DIAGNOSIS — K5792 Diverticulitis of intestine, part unspecified, without perforation or abscess without bleeding: Secondary | ICD-10-CM

## 2016-10-26 DIAGNOSIS — E669 Obesity, unspecified: Secondary | ICD-10-CM | POA: Diagnosis present

## 2016-10-26 DIAGNOSIS — K5720 Diverticulitis of large intestine with perforation and abscess without bleeding: Principal | ICD-10-CM | POA: Diagnosis present

## 2016-10-26 DIAGNOSIS — R739 Hyperglycemia, unspecified: Secondary | ICD-10-CM

## 2016-10-26 DIAGNOSIS — Z87891 Personal history of nicotine dependence: Secondary | ICD-10-CM

## 2016-10-26 DIAGNOSIS — R200 Anesthesia of skin: Secondary | ICD-10-CM

## 2016-10-26 DIAGNOSIS — Z833 Family history of diabetes mellitus: Secondary | ICD-10-CM

## 2016-10-26 DIAGNOSIS — M48061 Spinal stenosis, lumbar region without neurogenic claudication: Secondary | ICD-10-CM

## 2016-10-26 HISTORY — DX: Diverticulitis of intestine, part unspecified, without perforation or abscess without bleeding: K57.92

## 2016-10-26 HISTORY — DX: Diverticulitis of large intestine without perforation or abscess without bleeding: K57.32

## 2016-10-26 HISTORY — DX: Hyperglycemia, unspecified: R73.9

## 2016-10-26 LAB — CBC
HCT: 38.5 % (ref 35.0–47.0)
Hemoglobin: 12.1 g/dL (ref 12.0–16.0)
MCH: 23.5 pg — ABNORMAL LOW (ref 26.0–34.0)
MCHC: 31.3 g/dL — AB (ref 32.0–36.0)
MCV: 74.9 fL — AB (ref 80.0–100.0)
PLATELETS: 214 10*3/uL (ref 150–440)
RBC: 5.14 MIL/uL (ref 3.80–5.20)
RDW: 15.4 % — ABNORMAL HIGH (ref 11.5–14.5)
WBC: 9.5 10*3/uL (ref 3.6–11.0)

## 2016-10-26 LAB — URINALYSIS, COMPLETE (UACMP) WITH MICROSCOPIC
Bilirubin Urine: NEGATIVE
Glucose, UA: NEGATIVE mg/dL
Hgb urine dipstick: NEGATIVE
KETONES UR: 20 mg/dL — AB
Leukocytes, UA: NEGATIVE
Nitrite: NEGATIVE
PH: 5 (ref 5.0–8.0)
PROTEIN: 30 mg/dL — AB
Specific Gravity, Urine: 1.03 (ref 1.005–1.030)

## 2016-10-26 LAB — COMPREHENSIVE METABOLIC PANEL
ALBUMIN: 3.7 g/dL (ref 3.5–5.0)
ALK PHOS: 94 U/L (ref 38–126)
ALT: 11 U/L — AB (ref 14–54)
AST: 14 U/L — ABNORMAL LOW (ref 15–41)
Anion gap: 11 (ref 5–15)
BUN: 10 mg/dL (ref 6–20)
CHLORIDE: 108 mmol/L (ref 101–111)
CO2: 23 mmol/L (ref 22–32)
CREATININE: 0.66 mg/dL (ref 0.44–1.00)
Calcium: 9.3 mg/dL (ref 8.9–10.3)
GFR calc non Af Amer: >60 mL/min (ref >60)
GLUCOSE: 104 mg/dL — AB (ref 65–99)
Potassium: 3.9 mmol/L (ref 3.5–5.1)
Sodium: 142 mmol/L (ref 135–145)
Total Bilirubin: 0.7 mg/dL (ref 0.3–1.2)
Total Protein: 7.5 g/dL (ref 6.5–8.1)

## 2016-10-26 LAB — TSH: TSH: 0.744 u[IU]/mL (ref 0.350–4.500)

## 2016-10-26 LAB — LIPASE, BLOOD: LIPASE: 22 U/L (ref 11–51)

## 2016-10-26 MED ORDER — ONDANSETRON HCL 4 MG/2ML IJ SOLN: 4.0000 mg | Freq: Four times a day (QID) | INTRAMUSCULAR | Status: DC | PRN

## 2016-10-26 MED ORDER — PIPERACILLIN-TAZOBACTAM 3.375 G IVPB
INTRAVENOUS | Status: AC
  Administered 2016-10-26: 3.375 g via INTRAVENOUS
  Filled 2016-10-26: qty 50

## 2016-10-26 MED ORDER — CIPROFLOXACIN IN D5W 400 MG/200ML IV SOLN: 400.0000 mg | Freq: Once | INTRAVENOUS | Status: DC

## 2016-10-26 MED ORDER — POTASSIUM CHLORIDE IN NACL 20-0.9 MEQ/L-% IV SOLN
INTRAVENOUS | Status: DC
  Administered 2016-10-26 – 2016-10-29 (×7): via INTRAVENOUS
  Filled 2016-10-26 (×8): qty 1000

## 2016-10-26 MED ORDER — ACETAMINOPHEN 325 MG PO TABS
650.0000 mg | ORAL_TABLET | Freq: Four times a day (QID) | ORAL | Status: DC | PRN
  Administered 2016-10-29: 650 mg via ORAL
  Filled 2016-10-26: qty 2

## 2016-10-26 MED ORDER — NICOTINE 21 MG/24HR TD PT24
21.0000 mg | MEDICATED_PATCH | Freq: Every day | TRANSDERMAL | Status: DC
  Administered 2016-10-26 – 2016-10-29 (×4): 21 mg via TRANSDERMAL
  Filled 2016-10-26 (×4): qty 1

## 2016-10-26 MED ORDER — MORPHINE SULFATE (PF) 4 MG/ML IV SOLN
4.0000 mg | Freq: Once | INTRAVENOUS | Status: AC
  Administered 2016-10-26: 4 mg via INTRAVENOUS
  Filled 2016-10-26: qty 1

## 2016-10-26 MED ORDER — OXYCODONE-ACETAMINOPHEN 7.5-325 MG PO TABS
1.0000 | ORAL_TABLET | ORAL | Status: DC | PRN
  Administered 2016-10-26 – 2016-10-27 (×4): 1 via ORAL
  Filled 2016-10-26 (×4): qty 1

## 2016-10-26 MED ORDER — IOPAMIDOL (ISOVUE-300) INJECTION 61%
30.0000 mL | INTRAVENOUS | Status: AC
  Administered 2016-10-26 (×2): 15 mL via ORAL

## 2016-10-26 MED ORDER — ONDANSETRON HCL 4 MG/2ML IJ SOLN
4.0000 mg | Freq: Once | INTRAMUSCULAR | Status: AC
  Administered 2016-10-26: 4 mg via INTRAVENOUS
  Filled 2016-10-26: qty 2

## 2016-10-26 MED ORDER — METRONIDAZOLE IN NACL 5-0.79 MG/ML-% IV SOLN: 500.0000 mg | Freq: Once | INTRAVENOUS | Status: DC

## 2016-10-26 MED ORDER — PIPERACILLIN-TAZOBACTAM 3.375 G IVPB
3.3750 g | Freq: Three times a day (TID) | INTRAVENOUS | Status: DC
  Administered 2016-10-26 – 2016-10-29 (×8): 3.375 g via INTRAVENOUS
  Filled 2016-10-26 (×10): qty 50

## 2016-10-26 MED ORDER — MORPHINE SULFATE (PF) 4 MG/ML IV SOLN
4.0000 mg | INTRAVENOUS | Status: DC | PRN
  Administered 2016-10-26 – 2016-10-28 (×3): 4 mg via INTRAVENOUS
  Filled 2016-10-26 (×3): qty 1

## 2016-10-26 MED ORDER — PIPERACILLIN-TAZOBACTAM 3.375 G IVPB
INTRAVENOUS | Status: AC
  Filled 2016-10-26: qty 50

## 2016-10-26 MED ORDER — PIPERACILLIN-TAZOBACTAM 3.375 G IVPB 30 MIN
3.3750 g | Freq: Once | INTRAVENOUS | Status: AC
  Administered 2016-10-26: 3.375 g via INTRAVENOUS

## 2016-10-26 MED ORDER — ONDANSETRON HCL 4 MG PO TABS: 4.0000 mg | ORAL_TABLET | Freq: Four times a day (QID) | ORAL | Status: DC | PRN

## 2016-10-26 MED ORDER — ENOXAPARIN SODIUM 40 MG/0.4ML ~~LOC~~ SOLN
40.0000 mg | Freq: Two times a day (BID) | SUBCUTANEOUS | Status: DC
  Administered 2016-10-26 – 2016-10-29 (×6): 40 mg via SUBCUTANEOUS
  Filled 2016-10-26 (×6): qty 0.4

## 2016-10-26 MED ORDER — ACETAMINOPHEN 650 MG RE SUPP: 650.0000 mg | Freq: Four times a day (QID) | RECTAL | Status: DC | PRN

## 2016-10-26 MED ORDER — IOPAMIDOL (ISOVUE-300) INJECTION 61%
125.0000 mL | Freq: Once | INTRAVENOUS | Status: AC | PRN
  Administered 2016-10-26: 11:00:00 via INTRAVENOUS

## 2016-10-26 MED ORDER — HYDROXYZINE HCL 25 MG PO TABS
25.0000 mg | ORAL_TABLET | Freq: Four times a day (QID) | ORAL | Status: DC | PRN
  Administered 2016-10-26: 25 mg via ORAL
  Filled 2016-10-26: qty 1

## 2016-10-26 NOTE — Progress Notes (Signed)
Pharmacy Antibiotic Note  Taylor Ray is a 56 y.o. female admitted on 10/26/2016 with IAI.  Pharmacy has been consulted for piperacillin/tazobactam dosing.  Patient has a listed PCN allergy on profile listed as an allergy from childhood with no specified reaction. Patient received a dose of piperacillin/tazobactam in the ED this afternoon ~1300. Discussed with MD and instructed to follow up with RN to confirm no signs/symptoms of allergic reaction before completing consult. Per RN, no signs or symptoms of allergic reaction at this time.  Plan: Piperacillin/tazobactam 3.375 g IV q8h EI  Height: 5\' 4"  (162.6 cm) Weight: 260 lb (117.9 kg) IBW/kg (Calculated) : 54.7  Temp (24hrs), Avg:99 F (37.2 C), Min:98.8 F (37.1 C), Max:99.2 F (37.3 C)   Recent Labs Lab 10/26/16 0903  WBC 9.5  CREATININE 0.66    Estimated Creatinine Clearance: 99.2 mL/min (by C-G formula based on SCr of 0.66 mg/dL).    Allergies  Allergen Reactions  . Penicillins Other (See Comments)    sts allergy from childhood  . Benadryl [Diphenhydramine Hcl] Hives and Anxiety   Antimicrobials this admission: Piperacillin/tazobactam 4/30 >>   Dose adjustments this admission:  Microbiology results: None  Thank you for allowing pharmacy to be a part of this patient's care.  Lenis Noon, PharmD, BCPS Clinical Pharmacist 10/26/2016 3:49 PM

## 2016-10-26 NOTE — H&P (Signed)
[facility] [place] NAME: Taylor Ray    MR#:  [id number]  DATE OF BIRTH:  Jun 10, 1961  DATE OF ADMISSION:  10/26/2016  PRIMARY CARE PHYSICIAN: No PCP Per Patient   REQUESTING/REFERRING PHYSICIAN:   CHIEF COMPLAINT:   Chief Complaint  Patient presents with  . Abdominal Pain    HISTORY OF PRESENT ILLNESS: Taylor Ray  is a 56 y.o. female with a known history of Diverticular disease, the vertigo lasted in the past with rupture into the vent 11 2012, degenerative disc disease, ongoing tobacco abuse, who presents to the hospital with complaints of left lower quadrant abdominal pain. However, due to the patient, she has been suffering with left lower quadrant and lower abdominal pain for the past 3-4 weeks. No pain that would come and go, and go, worsens with food intake, Coumadin by nausea. She was seen by emergency room physicians, treated with Cipro and Flagyl recently, however, her pain worsened today and she came back. Repeated CT scan revealed possible phlegmon and hospitalist services were contacted for admission. She also admits of a right lower extremity numbness going on for the past one year, worsening and now extending to the left side of vagina and buttock area.  PAST MEDICAL HISTORY:  . Status post fracture in 2011, a and 11, ongoing tobacco abuse, degenerative disc disease and lumbar spine, history of hyperlipidemia, obesity, history of chronic pelvic pain/ovarian cysts  PAST SURGICAL HISTORY: Past Surgical History:  Procedure Laterality Date  . ABLATION ON ENDOMETRIOSIS    . TONSILLECTOMY      SOCIAL HISTORY:  Social History  Substance Use Topics  . Smoking status: Former Research scientist (life sciences)  . Smokeless tobacco: Never Used  . 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 No alcohol. No regular exercise  regimen. Patient's mother has hypertension, hyperlipidemia, knee replacement, patient's father hypertension, sister diabetes, 3 brothers, one of them had sickle cell disease.  DRUG ALLERGIES:  Allergies  Allergen Reactions  . Penicillins Other (See Comments)    sts allergy from childhood  . Benadryl [Diphenhydramine Hcl] Hives and Anxiety    Review of Systems  Constitutional: Positive for malaise/fatigue. Negative for chills, fever and weight loss.  HENT: Negative for congestion.   Eyes: Negative for blurred vision and double vision.  Respiratory: Negative for cough, sputum production, shortness of breath and wheezing.   Cardiovascular: Negative for chest pain, palpitations, orthopnea, leg swelling and PND.  Gastrointestinal: Positive for abdominal pain, blood in stool, constipation, nausea and vomiting. Negative for diarrhea.  Genitourinary: Positive for frequency. Negative for dysuria, hematuria and urgency.  Musculoskeletal: Negative for falls.  Neurological: Positive for sensory change. Negative for dizziness, tremors, focal weakness and headaches.  Endo/Heme/Allergies: Does not bruise/bleed easily.  Psychiatric/Behavioral: Negative for depression. The patient does not have insomnia.     MEDICATIONS AT HOME:  Prior to Admission medications   Medication Sig Start Date End Date Taking? Authorizing Provider  naproxen sodium (ANAPROX) 220 MG tablet Take 220 mg by mouth 2 (two) times daily with a meal.   Yes Historical Provider, MD  oxyCODONE-acetaminophen (PERCOCET) 7.5-325 MG tablet Take 1 tablet by mouth every 4 (four) hours as needed for severe pain. 10/01/16 10/01/17 Yes Earleen Newport, MD  ciprofloxacin (CIPRO) 500 MG tablet Take 1 tablet (500 mg total) by mouth 2 (two) times daily.  Patient not taking: Reported on 10/26/2016 09/25/16   Lavonia Drafts, MD  metroNIDAZOLE (FLAGYL) 500 MG tablet Take 1 tablet (500 mg total) by mouth 2 (two) times daily after a meal. Patient not taking:  Reported on 10/26/2016 09/25/16   Lavonia Drafts, MD  predniSONE (STERAPRED UNI-PAK 21 TAB) 10 MG (21) TBPK tablet Dispense steroid taper pack as directed Patient not taking: Reported on 10/26/2016 10/01/16   Earleen Newport, MD  traMADol (ULTRAM) 50 MG tablet Take 1 tablet (50 mg total) by mouth every 6 (six) hours as needed. Patient not taking: Reported on 10/26/2016 09/25/16 09/25/17  Lavonia Drafts, MD      PHYSICAL EXAMINATION:   VITAL SIGNS: Blood pressure 140/76, pulse 82, temperature 99.2 F (37.3 C), temperature source Oral, resp. rate 16, height 5\' 4"  (1.626 m), weight 117.9 kg (260 lb), SpO2 98 %.  GENERAL:  56 y.o.-year-old patient Sitting on the edge of the e bed with no acute distress, tearful, uncomfortable due to pain in abdomen.  EYES: Pupils equal, round, reactive to light and accommodation. No scleral icterus. Extraocular muscles intact.  HEENT: Head atraumatic, normocephalic. Oropharynx and nasopharynx clear.  NECK:  Supple, no jugular venous distention. No thyroid enlargement, no tenderness.  LUNGS: Normal breath sounds bilaterally, no wheezing, rales,rhonchi or crepitation. No use of accessory muscles of respiration. Diminished at bases, poor effort CARDIOVASCULAR: S1, S2 normal. No murmurs, rubs, or gallops.  ABDOMEN: Soft, tender diffusely, mostly in the left side, less rebound, voluntary guarding, nondistended. Bowel sounds present, diminished. No organomegaly or mass.  EXTREMITIES: No pedal edema, cyanosis, or clubbing.  NEUROLOGIC: Cranial nerves II through XII are intact. Muscle strength 5/5 in all extremities. Sensation decreased to light touch in posterior aspect of right lower extremity in the back of the leg, buttock area. Gait not checked.  PSYCHIATRIC: The patient is alert and oriented x 3.  SKIN: No obvious rash, lesion, or ulcer.   LABORATORY PANEL:   CBC  Recent Labs Lab 10/26/16 0903  WBC 9.5  HGB 12.1  HCT 38.5  PLT 214  MCV 74.9*  MCH 23.5*    MCHC 31.3*  RDW 15.4*   ------------------------------------------------------------------------------------------------------------------  Chemistries   Recent Labs Lab 10/26/16 0903  NA 142  K 3.9  CL 108  CO2 23  GLUCOSE 104*  BUN 10  CREATININE 0.66  CALCIUM 9.3  AST 14*  ALT 11*  ALKPHOS 94  BILITOT 0.7   ------------------------------------------------------------------------------------------------------------------  Cardiac Enzymes No results for input(s): TROPONINI in the last 168 hours. ------------------------------------------------------------------------------------------------------------------  RADIOLOGY: Ct Abdomen Pelvis W Contrast  Result Date: 10/26/2016 CLINICAL DATA:  Lower abdominal pain with vomiting for 1 week. History diverticulitis. EXAM: CT ABDOMEN AND PELVIS WITH CONTRAST TECHNIQUE: Multidetector CT imaging of the abdomen and pelvis was performed using the standard protocol following bolus administration of intravenous contrast. CONTRAST:  <See Chart> ISOVUE-300 IOPAMIDOL (ISOVUE-300) INJECTION 61%, <See Chart> ISOVUE-300 IOPAMIDOL (ISOVUE-300) INJECTION 61% COMPARISON:  10/01/2016 FINDINGS: Lower chest: Clear lung bases. Mild cardiomegaly, without pericardial or pleural effusion. Hepatobiliary: Normal liver. Normal gallbladder, without biliary ductal dilatation. Pancreas: Normal, without mass or ductal dilatation. Spleen: Normal in size, without focal abnormality. Adrenals/Urinary Tract: Normal adrenal glands. Upper pole left renal cyst and too small to characterize lesion. Normal right kidney. Normal urinary bladder. Stomach/Bowel: Normal stomach, without wall thickening. Colonic diverticulosis. Wall thickening of and inflammation surrounding the sigmoid are again identified. Minimally improved since 10/01/2016. Again identified is a collection within the posterior wall of the sigmoid which measures  2.1 cm and slightly greater than fluid density on  image 71/series 2. This is similar to on the prior exam (when remeasured). No extraluminal gas or fluid collection. Normal terminal ileum and appendix. Vascular/Lymphatic: Aortic and branch vessel atherosclerosis. No abdominopelvic adenopathy. Reproductive: Normal uterus and adnexa. Other: No significant free fluid. Musculoskeletal: Degenerative disc disease at L5-S1. Grade 1 L3-4 anterolisthesis. IMPRESSION: 1. Recurrent or persistent diverticulitis involving the sigmoid. Similar appearance of a collection of hypoattenuation in the posterior sigmoid wall, suspicious for intramural phlegmon or developing abscess. 2.  Aortic atherosclerosis. Electronically Signed   By: Abigail Miyamoto M.D.   On: 10/26/2016 11:44    EKG: Orders placed or performed during the hospital encounter of 09/25/16  . EKG    IMPRESSION AND PLAN:  Active Problems:   Diverticulitis of colon   Hyperglycemia   Sigmoid diverticulitis  #1: Sigmoid colon Diverticulitis with suspected phlegmon, admit patient to medical floor, initiate Zosyn, pain medications intravenously, IV fluids with potassium, clear liquid diet, surgical consultation #2. Hyperglycemia, get hemoglobin A1c to rule out diabetes #3. Right extremity numbness of unclear etiology at this time, progressing over the past one year, per history, getting neurologist involved for recommendations #4. History of hypertension, follow blood pressure readings closely and initiate medications if needed #5. Obesity, get TSH, hemoglobin, A1c, lipid panel #6. Tobacco abuse. Counseling, discussed this patient for approximately 4 minutes, nicotine replacement therapy will be initiated, she is agreeable  All the records are reviewed and case discussed with ED provider. Management plans discussed with the patient, family and they are in agreement.  CODE STATUS: Code Status History    This patient does not have a recorded code status. Please follow your organizational policy for  patients in this situation.       TOTAL TIME TAKING CARE OF THIS PATIENT: 50 minutes.    Theodoro Grist M.D on 10/26/2016 at 1:05 PM  Between 7am to 6pm - Pager - [phone] After 6pm go to [email or web address] - password [email or web address] [facility]  [facility]  Office  [phone]  CC: Primary care physician; No PCP Per Patient

## 2016-10-26 NOTE — Consult Note (Signed)
SURGICAL CONSULTATION NOTE (initial) - cpt: [id number]  HISTORY OF PRESENT ILLNESS (HPI):  56 y.o. very pleasant female presented with worsening recurrent LLQ abdominal pain that patient reports she first experienced and presented to [facility] ED 10/01/2016, at which time she was prescribed Cipro and Flagyl and instructed to return if her symptoms did not continue to improve. Because her pain did not improve, she returned as instructed and was instead prescribed Augmentin, which she says she took as prescribed until she finished her prescription. Upon finishing her prescription, she was no longer experiencing any abdominal pain and decided to go on a road trip to visit friends/family in [place]. Not until she had nearly driven back home to [place] ~1 week later did she again begin to experience LLQ abdominal pain, initially very mild and then worse following an episode of emesis 4 days ago (last Thursday, 10/22/2016), since which time the pain again worsened. Patient states she underwent colonoscopy ~2003 when first diagnosed with diverticulosis, but she has not followed up for repeat (routine screening) colonoscopy >70 years old due to lack of insurance.  Surgery is consulted by medical physician Dr. Ether Griffins in this context for evaluation and management of sigmoid colonic diverticulitis with phlegmon.  PAST MEDICAL HISTORY (PMH):  History reviewed. No pertinent past medical history.   PAST SURGICAL HISTORY ([place]):  Past Surgical History:  Procedure Laterality Date  . ABLATION ON ENDOMETRIOSIS    . TONSILLECTOMY       MEDICATIONS:  Prior to Admission medications   Medication Sig Start Date End Date Taking? Authorizing Provider  naproxen sodium (ANAPROX) 220 MG tablet Take 220 mg by mouth 2 (two) times daily with a meal.   Yes Historical Provider, MD  oxyCODONE-acetaminophen (PERCOCET) 7.5-325 MG tablet Take 1 tablet by mouth every 4 (four) hours as needed for severe pain. 10/01/16 10/01/17 Yes Earleen Newport, MD  ciprofloxacin (CIPRO) 500 MG tablet Take 1 tablet (500 mg total) by mouth 2 (two) times daily. Patient not taking: Reported on 10/26/2016 09/25/16   Lavonia Drafts, MD  metroNIDAZOLE (FLAGYL) 500 MG tablet Take 1 tablet (500 mg total) by mouth 2 (two) times daily after a meal. Patient not taking: Reported on 10/26/2016 09/25/16   Lavonia Drafts, MD  predniSONE (STERAPRED UNI-PAK 21 TAB) 10 MG (21) TBPK tablet Dispense steroid taper pack as directed Patient not taking: Reported on 10/26/2016 10/01/16   Earleen Newport, MD  traMADol (ULTRAM) 50 MG tablet Take 1 tablet (50 mg total) by mouth every 6 (six) hours as needed. Patient not taking: Reported on 10/26/2016 09/25/16 09/25/17  Lavonia Drafts, MD     ALLERGIES:  Allergies  Allergen Reactions  . Penicillins Other (See Comments)    sts allergy from childhood  . Benadryl [Diphenhydramine Hcl] Hives and Anxiety     SOCIAL HISTORY:  Social History   Social History  . Marital status: Single    Spouse name: N/A  . Number of children: N/A  . Years of education: N/A   Occupational History  . Not on file.   Social History Main Topics  . Smoking status: Former Research scientist (life sciences)  . Smokeless tobacco: Never Used  . Alcohol use Not on file  . Drug use: Unknown  . Sexual activity: Not on file   Other Topics Concern  . Not on file   Social History Narrative  . No narrative on file    The patient currently resides (home / rehab facility / nursing home): Home  The patient  normally is (ambulatory / bedbound): Ambulatory  FAMILY HISTORY:  No family history on file.   REVIEW OF SYSTEMS:  Constitutional: denies weight loss, fever, chills, or sweats  Eyes: denies any other vision changes, history of eye injury  ENT: denies sore throat, hearing problems  Respiratory: denies shortness of breath, wheezing  Cardiovascular: denies chest pain, palpitations  Gastrointestinal: abdominal pain, N/V, and bowel function as per HPI Genitourinary:  denies burning with urination or urinary frequency Musculoskeletal: denies any other joint pains or cramps  Skin: denies any other rashes or skin discolorations  Neurological: denies any other headache, dizziness, weakness  Psychiatric: denies any other depression, anxiety   All other review of systems were negative   VITAL SIGNS:  Temp:  [98.8 F (37.1 C)-99.2 F (37.3 C)] 98.8 F (37.1 C) (04/30 1312) Pulse Rate:  [67-82] 67 (04/30 1312) Resp:  [16-20] 20 (04/30 1312) BP: (131-140)/(76-77) 131/77 (04/30 1312) SpO2:  [98 %-100 %] 100 % (04/30 1312) Weight:  [260 lb (117.9 kg)] 260 lb (117.9 kg) (04/30 0851)     Height: 5\' 4"  (162.6 cm) Weight: 260 lb (117.9 kg) BMI (Calculated): 44.7   INTAKE/OUTPUT:  This shift: Total I/O In: 50 [IV Piggyback:50] Out: -   Last 2 shifts: @IOLAST2SHIFTS @   PHYSICAL EXAM:  Constitutional:  -- Obese body habitus  -- Awake, alert, and oriented x3  Eyes:  -- Pupils equally round and reactive to light  -- No scleral icterus  Ear, nose, and throat:  -- No jugular venous distension  Pulmonary:  -- No crackles  -- Equal breath sounds bilaterally -- Breathing non-labored at rest Cardiovascular:  -- S1, S2 present  -- No pericardial rubs Gastrointestinal:  -- Abdomen soft, with mild LLQ abdominal tenderness to deep palpation, nondistended, no guarding/rebound  -- No abdominal masses appreciated, pulsatile or otherwise  Musculoskeletal and Integumentary:  -- Wounds or skin discoloration: None appreciated -- Extremities: B/L UE and LE FROM, hands and feet warm, no edema  Neurologic:  -- Motor function: intact and symmetric -- Sensation: intact and symmetric  Labs:  CBC:  Lab Results  Component Value Date   WBC 9.5 10/26/2016   RBC 5.14 10/26/2016   BMP:  Lab Results  Component Value Date   GLUCOSE 104 (H) 10/26/2016   CO2 23 10/26/2016   BUN 10 10/26/2016   CREATININE 0.66 10/26/2016   CALCIUM 9.3 10/26/2016     Imaging  studies:  CT Abdomen and Pelvis with Contrast (10/26/2016) Normal stomach, without wall thickening.  Colonic diverticulosis. Wall thickening of and inflammation surrounding the sigmoid are again identified. Minimally improved since 10/01/2016. Again identified is a collection within the posterior wall of the sigmoid which measures 2.1 cm and slightly greater than fluid density on image 71/series 2, suspicious for  intramural phlegmon or developing abscess.. This is similar to on the prior exam (when remeasured). No extraluminal gas or  fluid collection. Normal terminal ileum and appendix.  Vascular/Lymphatic: Aortic and branch vessel atherosclerosis. No abdominopelvic adenopathy.  CT Abdomen and Pelvis with Contrast (10/01/2016) Stomach is within normal limits. There is no evidence of bowel obstruction. The the scratched of the sigmoid colon demonstrates diverticulosis and a moderate wall thickening with surrounding inflammatory stranding. There is a 1.8 x 1.5 cm low-density, peripherally enhancing focus which appears to be within the wall of the sigmoid colon (series 2, image 66). The appendix is unremarkable.  Assessment/Plan: (ICD-10's: K81.32) 56 y.o. female with incompletely treated sigmoid colonic diverticulitis with ~2 cm intramural  phlegmon, complicated by pertinent comorbidities including obesity (BMI 45), former tobacco abuse, and a lack of routine and preventative medical care due to chronic uninsured status.   - no more than clear liquids diet for now  - suspect incomplete treatment of diverticultitis with phlegmon  - Zosyn IV antibiotics for persistent/recurrent acute diverticulitis with phlegmon  - slowly advance diet with improved symptoms, would treat with 2 weeks antibiotics considering phlegmon  - no indication for urgent surgical intervention at this time  - monitor bowel function and abdominal exam  - will continue to follow with primary team  - DVT prophylaxis  All of  the above findings and recommendations were discussed with the patient, and all of patient's questions were answered to her expressed satisfaction.  Thank you for the opportunity to participate in this patient's care.   -- Marilynne Drivers Rosana Hoes, MD, [place]: [facility] - Partnering for exceptional care. Office: [phone]

## 2016-10-26 NOTE — Progress Notes (Signed)
Anticoagulation monitoring(Lovenox):  56 yo female ordered Lovenox 30 mg Q24h  Filed Weights   10/26/16 0840 10/26/16 0851  Weight: 260 lb (117.9 kg) 260 lb (117.9 kg)   BMI 44.7   Lab Results  Component Value Date   CREATININE 0.66 10/26/2016   CREATININE 0.69 10/01/2016   CREATININE 0.55 09/25/2016   Estimated Creatinine Clearance: 99.2 mL/min (by C-G formula based on SCr of 0.66 mg/dL). Hemoglobin & Hematocrit     Component Value Date/Time   HGB 12.1 10/26/2016 0903   HCT 38.5 10/26/2016 0903     Per Protocol for Patient with estCrcl > 30 ml/min and BMI > 40, will transition to Lovenox 40 mg Q12h.

## 2016-10-26 NOTE — ED Notes (Signed)
Pt taken to room 6. Pt removing clothes and given gown. Pt given warm blanket. RN notified of pt in room.

## 2016-10-26 NOTE — ED Triage Notes (Signed)
Pt with abd pain for a mth now. Has been seen prior for the same.

## 2016-10-26 NOTE — ED Provider Notes (Signed)
[facility] Emergency Department Provider Note   ____________________________________________    I have reviewed the triage vital signs and the nursing notes.   HISTORY  Chief Complaint Abdominal Pain     HPI Taylor Ray is a 56 y.o. female who presents with complaints of abdominal pain. Patient reports this pain started 4 days ago. It is in her lower abdomen primarily right greater than left. She reports the pain is sharp in nature. She has been drinking lots of water and only eating soup. She has had this pain before and it was related to diverticulitis. In fact I saw this patient over a month ago, at that time she did not want to stay in the hospital and wanted to try by mouth antibiotics. Apparently she did get better for a brief period of time. But 4 days ago the pain started again, she has had intermittent vomiting as well. Denies fevers.   History reviewed. No pertinent past medical history.  Patient Active Problem List   Diagnosis Date Noted  . Diverticulitis of colon 10/26/2016  . Hyperglycemia 10/26/2016  . Sigmoid diverticulitis 10/26/2016    Past Surgical History:  Procedure Laterality Date  . ABLATION ON ENDOMETRIOSIS    . TONSILLECTOMY      Prior to Admission medications   Medication Sig Start Date End Date Taking? Authorizing Provider  naproxen sodium (ANAPROX) 220 MG tablet Take 220 mg by mouth 2 (two) times daily with a meal.   Yes Historical Provider, MD  oxyCODONE-acetaminophen (PERCOCET) 7.5-325 MG tablet Take 1 tablet by mouth every 4 (four) hours as needed for severe pain. 10/01/16 10/01/17 Yes Earleen Newport, MD  ciprofloxacin (CIPRO) 500 MG tablet Take 1 tablet (500 mg total) by mouth 2 (two) times daily. Patient not taking: Reported on 10/26/2016 09/25/16   Lavonia Drafts, MD  metroNIDAZOLE (FLAGYL) 500 MG tablet Take 1 tablet (500 mg total) by mouth 2 (two) times daily after a meal. Patient not taking: Reported on  10/26/2016 09/25/16   Lavonia Drafts, MD  predniSONE (STERAPRED UNI-PAK 21 TAB) 10 MG (21) TBPK tablet Dispense steroid taper pack as directed Patient not taking: Reported on 10/26/2016 10/01/16   Earleen Newport, MD  traMADol (ULTRAM) 50 MG tablet Take 1 tablet (50 mg total) by mouth every 6 (six) hours as needed. Patient not taking: Reported on 10/26/2016 09/25/16 09/25/17  Lavonia Drafts, MD     Allergies Penicillins and Benadryl [diphenhydramine hcl]  No family history on file.  Social History Social History  Substance Use Topics  . Smoking status: Former Research scientist (life sciences)  . Smokeless tobacco: Never Used  . Alcohol use Not on file    Review of Systems  Constitutional: No fever/chills Eyes: No visual changes.  ENT: No sore throat. Cardiovascular: Denies chest pain. Respiratory: Denies shortness of breath. Gastrointestinal: As above  Genitourinary: Negative for dysuria. No frequency Musculoskeletal: Negative for back pain. Skin: Negative for rash. Neurological: Negative for headaches    ____________________________________________   PHYSICAL EXAM:  VITAL SIGNS: ED Triage Vitals  Enc Vitals Group     BP 10/26/16 0849 140/76     Pulse Rate 10/26/16 0849 82     Resp 10/26/16 0849 16     Temp 10/26/16 0849 99.2 F (37.3 C)     Temp Source 10/26/16 0849 Oral     SpO2 10/26/16 0849 98 %     Weight 10/26/16 0840 260 lb (117.9 kg)     Height 10/26/16 0851 5'  4" (1.626 m)     Head Circumference --      Peak Flow --      Pain Score 10/26/16 0840 9     Pain Loc --      Pain Edu? --      Excl. in [place]? --     Constitutional: Alert and oriented. No acute distress.  Eyes: Conjunctivae are normal.   Nose: No congestion/rhinnorhea. Mouth/Throat: Mucous membranes are moist.   Neck:  Painless ROM Cardiovascular: Normal rate, regular rhythm.  Good peripheral circulation. Respiratory: Normal respiratory effort.  No retractions.  Gastrointestinal: Tender to palpation lower abdomen  bilaterally.No distention.  No CVA tenderness. Genitourinary: deferred Musculoskeletal:  Warm and well perfused Neurologic:  Normal speech and language. No gross focal neurologic deficits are appreciated.  Skin:  Skin is warm, dry and intact. No rash noted. Psychiatric: Mood and affect are normal. Speech and behavior are normal.  ____________________________________________   LABS (all labs ordered are listed, but only abnormal results are displayed)  Labs Reviewed  COMPREHENSIVE METABOLIC PANEL - Abnormal; Notable for the following:       Result Value   Glucose, Bld 104 (*)    AST 14 (*)    ALT 11 (*)    All other components within normal limits  CBC - Abnormal; Notable for the following:    MCV 74.9 (*)    MCH 23.5 (*)    MCHC 31.3 (*)    RDW 15.4 (*)    All other components within normal limits  URINALYSIS, COMPLETE (UACMP) WITH MICROSCOPIC - Abnormal; Notable for the following:    Color, Urine AMBER (*)    APPearance HAZY (*)    Ketones, ur 20 (*)    Protein, ur 30 (*)    Bacteria, UA FEW (*)    Squamous Epithelial / LPF 6-30 (*)    All other components within normal limits  LIPASE, BLOOD   ____________________________________________  EKG  None ____________________________________________  RADIOLOGY  CT abdomen and pelvis ____________________________________________   PROCEDURES  Procedure(s) performed: No    Critical Care performed:No ____________________________________________   INITIAL IMPRESSION / ASSESSMENT AND PLAN / ED COURSE  Pertinent labs & imaging results that were available during my care of the patient were reviewed by me and considered in my medical decision making (see chart for details).  She presents with lower abdominal pain, this is the same pain she has had before with diverticulitis. In early April she was diagnosed with microperforation due to diverticulitis and opted to do by mouth antibiotics. She was better for a period but  now her pain has returned and she is having vomiting. Strongly suspect diverticulitis. Given her history of perforation I will reimage, give IV morphine and IV Zofran   CT scan positive for diverticulitis. We'll treat with Zosyn given outpatient treatment failure. Admit to the hospitalist service   ____________________________________________   FINAL CLINICAL IMPRESSION(S) / ED DIAGNOSES  Final diagnoses:  Diverticulitis      NEW MEDICATIONS STARTED DURING THIS VISIT:  New Prescriptions   No medications on file     Note:  This document was prepared using Dragon voice recognition software and may include unintentional dictation errors.    Lavonia Drafts, MD 10/26/16 [phone]

## 2016-10-27 ENCOUNTER — Inpatient Hospital Stay: Payer: Self-pay

## 2016-10-27 DIAGNOSIS — R200 Anesthesia of skin: Secondary | ICD-10-CM

## 2016-10-27 DIAGNOSIS — R202 Paresthesia of skin: Secondary | ICD-10-CM

## 2016-10-27 LAB — BASIC METABOLIC PANEL
ANION GAP: 4 — AB (ref 5–15)
BUN: 8 mg/dL (ref 6–20)
CALCIUM: 8.5 mg/dL — AB (ref 8.9–10.3)
CO2: 28 mmol/L (ref 22–32)
CREATININE: 0.69 mg/dL (ref 0.44–1.00)
Chloride: 110 mmol/L (ref 101–111)
GFR calc non Af Amer: >60 mL/min (ref >60)
Glucose, Bld: 94 mg/dL (ref 65–99)
Potassium: 3.9 mmol/L (ref 3.5–5.1)
SODIUM: 142 mmol/L (ref 135–145)

## 2016-10-27 LAB — CBC
HCT: 35.6 % (ref 35.0–47.0)
HEMOGLOBIN: 11 g/dL — AB (ref 12.0–16.0)
MCH: 23.4 pg — ABNORMAL LOW (ref 26.0–34.0)
MCHC: 30.8 g/dL — ABNORMAL LOW (ref 32.0–36.0)
MCV: 76.2 fL — ABNORMAL LOW (ref 80.0–100.0)
PLATELETS: 190 10*3/uL (ref 150–440)
RBC: 4.68 MIL/uL (ref 3.80–5.20)
RDW: 15.6 % — ABNORMAL HIGH (ref 11.5–14.5)
WBC: 7 10*3/uL (ref 3.6–11.0)

## 2016-10-27 LAB — LIPID PANEL
CHOL/HDL RATIO: 4.3 ratio
CHOLESTEROL: 210 mg/dL — AB (ref 0–200)
HDL: 49 mg/dL (ref >40)
LDL Cholesterol: 142 mg/dL — ABNORMAL HIGH (ref 0–99)
Triglycerides: 97 mg/dL (ref <150)
VLDL: 19 mg/dL (ref 0–40)

## 2016-10-27 LAB — GLUCOSE, CAPILLARY: Glucose-Capillary: 75 mg/dL (ref 65–99)

## 2016-10-27 MED ORDER — BOOST / RESOURCE BREEZE PO LIQD
1.0000 | Freq: Three times a day (TID) | ORAL | Status: DC
  Administered 2016-10-27 – 2016-10-29 (×4): 1 via ORAL

## 2016-10-27 MED ORDER — OXYCODONE-ACETAMINOPHEN 7.5-325 MG PO TABS
1.0000 | ORAL_TABLET | ORAL | Status: DC | PRN
  Administered 2016-10-27 – 2016-10-29 (×8): 1 via ORAL
  Filled 2016-10-27 (×8): qty 1

## 2016-10-27 NOTE — Progress Notes (Signed)
SURGICAL PROGRESS NOTE (cpt [phone])  Hospital Day(s): 1.   Post op day(s):  Marland Kitchen   Interval History: Patient seen and examined, no acute events or new complaints overnight. Patient reports much improved but not entirely resolved LLQ abdominal pain, +ambulation, +flatus through no BM despite sensation of "pressure", denies fever/chills, CP, or SOB.  Review of Systems:  Constitutional: denies fever, chills  HEENT: denies cough or congestion  Respiratory: denies any shortness of breath  Cardiovascular: denies chest pain or palpitations  Gastrointestinal: abdominal pain, N/V, and bowel function as per interval history Genitourinary: denies burning with urination or urinary frequency Musculoskeletal: denies pain, decreased motor or sensation Integumentary: denies any other rashes or skin discolorations Neurological: denies HA or vision/hearing changes   Vital signs in last 24 hours: [min-max] current  Temp:  [97.3 F (36.3 C)-99.2 F (37.3 C)] 97.3 F (36.3 C) (05/01 0442) Pulse Rate:  [55-82] 61 (05/01 0442) Resp:  [16-20] 18 (05/01 0442) BP: (95-140)/(42-77) 119/50 (05/01 0442) SpO2:  [91 %-100 %] 91 % (05/01 0442) Weight:  [256 lb 9.6 oz (116.4 kg)-260 lb (117.9 kg)] 256 lb 9.6 oz (116.4 kg) (05/01 0532)     Height: 5\' 4"  (162.6 cm) Weight: 256 lb 9.6 oz (116.4 kg) BMI (Calculated): 44.7   Intake/Output this shift:  No intake/output data recorded.   Intake/Output last 2 shifts:  @IOLAST2SHIFTS @   Physical Exam:  Constitutional: alert, cooperative and no distress  HENT: normocephalic without obvious abnormality  Eyes: PERRL, EOM's grossly intact and symmetric  Neuro: CN II - XII grossly intact and symmetric without deficit  Respiratory: breathing non-labored at rest  Cardiovascular: regular rate and sinus rhythm  Gastrointestinal: soft, mild-/moderate- suprapubic > LLQ abdominal tenderness to palpation, and non-distended  Musculoskeletal: UE and LE FROM, no edema or wounds,  motor and sensation grossly intact, NT   Labs:  CBC Latest Ref Rng & Units 10/27/2016 10/26/2016 10/01/2016  WBC 3.6 - 11.0 K/uL 7.0 9.5 6.0  Hemoglobin 12.0 - 16.0 g/dL 11.0(L) 12.1 13.1  Hematocrit 35.0 - 47.0 % 35.6 38.5 42.0  Platelets 150 - 440 K/uL 190 214 231   BMP:  Lab Results  Component Value Date   GLUCOSE 94 10/27/2016   CO2 28 10/27/2016   BUN 8 10/27/2016   CREATININE 0.69 10/27/2016   CALCIUM 8.5 (L) 10/27/2016     Imaging studies: No new pertinent imaging studies   Assessment/Plan: (ICD-10's: K44.32) 56 y.o. female with improving incompletely treated sigmoid colonic diverticulitis considering ~2 cm intramural phlegmon, complicated by pertinent comorbidities including obesity (BMI 45), former tobacco abuse, and a lack of routine and preventative medical care due to chronic uninsured status.              - clear liquids today, will likely advance to low-residual diet tomorrow             - Zosyn IV antibiotics for persistent/recurrent acute diverticulitis with phlegmon             - advise antibiotic therapy x 2 weeks considering sigmoid phlegmon             - no indication for urgent surgical intervention at this time             - monitor bowel function and abdominal exam             - will continue to follow with primary team             - DVT prophylaxis  All of the above findings and recommendations were discussed with the patient, and all of patient's questions were answered to her expressed satisfaction.  Thank you for the opportunity to participate in this patient's care.   -- Marilynne Drivers Rosana Hoes, MD, [place]: [facility] - Partnering for exceptional care. Office: [phone]

## 2016-10-27 NOTE — Progress Notes (Signed)
Patient ID: Taylor Ray, female   DOB: [birth year], 56 y.o.   MRN: [id number]  [facility] PROGRESS NOTE  Taylor Ray SWF:[id number] DOB: 22-Jan-1961 DOA: 10/26/2016 PCP: No PCP Per Patient  HPI/Subjective: Patient feeling lower abdominal pain. Urgency to have a bowel movement. She has seen some blood in the bowel movement. Chronic left lower extremity numbness  Objective: Vitals:   10/27/16 1018 10/27/16 1334  BP: 130/63 134/66  Pulse: 82 78  Resp:    Temp:  97.7 F (36.5 C)    Filed Weights   10/26/16 0840 10/26/16 0851 10/27/16 0532  Weight: 117.9 kg (260 lb) 117.9 kg (260 lb) 116.4 kg (256 lb 9.6 oz)    ROS: Review of Systems  Constitutional: Negative for chills and fever.  Eyes: Negative for blurred vision.  Respiratory: Negative for cough and shortness of breath.   Cardiovascular: Negative for chest pain.  Gastrointestinal: Positive for abdominal pain. Negative for constipation, diarrhea, nausea and vomiting.  Genitourinary: Negative for dysuria.  Musculoskeletal: Negative for joint pain.  Neurological: Positive for sensory change. Negative for dizziness and headaches.   Exam: Physical Exam    Data Reviewed: Basic Metabolic Panel:  Recent Labs Lab 10/26/16 0903 10/27/16 0529  NA 142 142  K 3.9 3.9  CL 108 110  CO2 23 28  GLUCOSE 104* 94  BUN 10 8  CREATININE 0.66 0.69  CALCIUM 9.3 8.5*   Liver Function Tests:  Recent Labs Lab 10/26/16 0903  AST 14*  ALT 11*  ALKPHOS 94  BILITOT 0.7  PROT 7.5  ALBUMIN 3.7    Recent Labs Lab 10/26/16 0903  LIPASE 22   CBC:  Recent Labs Lab 10/26/16 0903 10/27/16 0529  WBC 9.5 7.0  HGB 12.1 11.0*  HCT 38.5 35.6  MCV 74.9* 76.2*  PLT 214 190    CBG:  Recent Labs Lab 10/27/16 0732  GLUCAP 75      Studies: Mr Lumbar Spine Wo Contrast  Result Date: 10/27/2016 CLINICAL DATA:  Chronic numbness in the toes of the left foot which has progressed since 2007 and now involves the  entire left foot and posterior aspect of the left leg, buttocks and perineum. No known injury. EXAM: MRI LUMBAR SPINE WITHOUT CONTRAST TECHNIQUE: Multiplanar, multisequence MR imaging of the lumbar spine was performed. No intravenous contrast was administered. COMPARISON:  CT abdomen and pelvis 10/26/2016. MRI lumbar spine 12/21/2012. FINDINGS: Segmentation:  Standard. Alignment: 0.4 cm anterolisthesis L3 on L4 due to facet degenerative disease is slightly increased since the prior MRI. Vertebrae: No fracture or worrisome lesion. Degenerative endplate signal change at L5-S1 is identified and appears worse than on the prior MRI. Conus medullaris: Extends to the T12-L1 level and appears normal. Paraspinal and other soft tissues: Small T2 hyperintense lesion left kidney is most compatible with a cyst. Otherwise negative. Disc levels: T10-11 and T11-12 are imaged in the sagittal plane only. Minimal disc bulging is seen at both levels without central canal or foraminal stenosis. T12-L1:  Negative. L1-2:  Mild facet degenerative disease.  Otherwise negative. L2-3: Mild to moderate facet degenerative change and ligamentum flavum thickening. There is a minimal disc bulge but the central canal and foramina are open. Spondylosis shows mild progression since the prior MRI. L3-4: Moderate facet arthropathy and ligamentum flavum thickening are seen. The disc is uncovered with a shallow bulge. Mild to moderate central canal stenosis has slightly progressed since the prior MRI. The foramina are open. L4-5: Advanced bilateral facet degenerative disease and  ligamentum flavum thickening have progressed since the prior examination. Focal ligamentum flavum thickening and/or osteophytosis off the medial margin of both facet joints is new since the prior study. There is a shallow disc bulge. Moderately severe central canal stenosis is present with narrowing of both subarticular recesses, worse on the left where the descending left L5  root appears compressed. There is mild bilateral foraminal narrowing. L5-S1: There is facet degenerative disease and some ligamentum flavum thickening. The central canal is open. Moderate to moderately severe bilateral foraminal narrowing has progressed since the prior exam. IMPRESSION: Progressive spondylosis worst at L4-5 where there is moderately severe central canal narrowing and left worse than right subarticular recess narrowing predominant due to bulky ligamentum flavum thickening and facet arthropathy. Shallow disc bulge is present at this level. The descending left L5 root appears compressed in the subarticular recess. Slight progression moderate central canal stenosis at L3-4 where facet degenerative disease results in 0.4 cm anterolisthesis. Electronically Signed   By: Inge Rise M.D.   On: 10/27/2016 12:53   Ct Abdomen Pelvis W Contrast  Result Date: 10/26/2016 CLINICAL DATA:  Lower abdominal pain with vomiting for 1 week. History diverticulitis. EXAM: CT ABDOMEN AND PELVIS WITH CONTRAST TECHNIQUE: Multidetector CT imaging of the abdomen and pelvis was performed using the standard protocol following bolus administration of intravenous contrast. CONTRAST:  <See Chart> ISOVUE-300 IOPAMIDOL (ISOVUE-300) INJECTION 61%, <See Chart> ISOVUE-300 IOPAMIDOL (ISOVUE-300) INJECTION 61% COMPARISON:  10/01/2016 FINDINGS: Lower chest: Clear lung bases. Mild cardiomegaly, without pericardial or pleural effusion. Hepatobiliary: Normal liver. Normal gallbladder, without biliary ductal dilatation. Pancreas: Normal, without mass or ductal dilatation. Spleen: Normal in size, without focal abnormality. Adrenals/Urinary Tract: Normal adrenal glands. Upper pole left renal cyst and too small to characterize lesion. Normal right kidney. Normal urinary bladder. Stomach/Bowel: Normal stomach, without wall thickening. Colonic diverticulosis. Wall thickening of and inflammation surrounding the sigmoid are again identified.  Minimally improved since 10/01/2016. Again identified is a collection within the posterior wall of the sigmoid which measures 2.1 cm and slightly greater than fluid density on image 71/series 2. This is similar to on the prior exam (when remeasured). No extraluminal gas or fluid collection. Normal terminal ileum and appendix. Vascular/Lymphatic: Aortic and branch vessel atherosclerosis. No abdominopelvic adenopathy. Reproductive: Normal uterus and adnexa. Other: No significant free fluid. Musculoskeletal: Degenerative disc disease at L5-S1. Grade 1 L3-4 anterolisthesis. IMPRESSION: 1. Recurrent or persistent diverticulitis involving the sigmoid. Similar appearance of a collection of hypoattenuation in the posterior sigmoid wall, suspicious for intramural phlegmon or developing abscess. 2.  Aortic atherosclerosis. Electronically Signed   By: Abigail Miyamoto M.D.   On: 10/26/2016 11:44    Scheduled Meds: . enoxaparin (LOVENOX) injection  40 mg Subcutaneous Q12H  . nicotine  21 mg Transdermal Daily   Continuous Infusions: . 0.9 % NaCl with KCl 20 mEq / L 100 mL/hr at 10/27/16 1126  . piperacillin-tazobactam (ZOSYN)  IV 3.375 g (10/27/16 1443)    Assessment/Plan:   1. Diverticulitis of colon with abscess formation. Patient failed outpatient therapy. IV Zosyn ordered. Appreciate surgery following. Need to watch closely. Will likely end up needing an outpatient colonoscopy once this settles down. 2. Lumbar radiculopathy with spinal stenosis and Chronic left leg numbness. Power intact. Would normally give steroids but with infection I would not do that. Physical therapy evaluation. 3. Tobacco abuse on nicotine patch 4. Obesity. Weight loss needed  Code Status:     Code Status Orders        Start  Ordered   10/26/16 1428  Full code  Continuous     10/26/16 1427    Code Status History    Date Active Date Inactive Code Status Order ID Comments User Context   This patient has a current code  status but no historical code status.      Disposition Plan: Will likely need IV antibiotics for a period of time and then reevaluation with CT scan  Consultants:  Surgery  Neurology  Antibiotics:  Zosyn  Time spent: 28 minutes  Riddle, [place]

## 2016-10-27 NOTE — Progress Notes (Signed)
Initial Nutrition Assessment  DOCUMENTATION CODES:   Morbid obesity  INTERVENTION:  Recommend Boost Breeze po TID, each supplement provides 250 kcal and 9 grams of protein.   Encouraged adequate intake as diet progressed.   NUTRITION DIAGNOSIS:   Inadequate oral intake related to poor appetite, other (see comment) (abdominal pain with diverticulitis) as evidenced by per patient/family report.  GOAL:   Patient will meet greater than or equal to 90% of their needs  MONITOR:   PO intake, Supplement acceptance, Diet advancement, Labs, Weight trends, I & O's  REASON FOR ASSESSMENT:   Malnutrition Screening Tool    ASSESSMENT:   56 year old female with PMHx of diverticulitis presented with abdominal pain found to have sigmoid colon diverticulitis with suspected phlegmon.   Spoke with patient at bedside. She reports she has not been eating well since the beginning of April 2018 (approximately 1 month). She reports she can tolerate water and one small meal per day. She has been eating [employer] hearty chicken noodle soup, and recently bought some yogurt. She endorses abdominal pain. Very cold liquids such as [place] Ice make abdominal pain worse. Patient reports she is very familiar with her diverticular disease and knows that she needs to follow a low-fiber diet during diverticulitis and then slowly increase fiber after it has improved. She reports drinking Benefiber at home typically when not in pain.  Patient reports at the beginning of 2018 she weighed 276 lbs. This would be a reported weight loss of 19.4 lbs (7% body weight) over 5 months, which is not significant for time frame. Per chart she was 260 lbs on 4/5 and has lost 3.4 lbs (1.3% body weight) over 1 month, which is not significant for time frame.  Meal Completion: 100% of CLD  Medications reviewed and include: NS @ 100 ml/hr, Zosyn.  Labs reviewed and none pertinent.  Nutrition-Focused physical exam completed.  Findings are no fat depletion, no muscle depletion, and no edema.   Patient is at risk for acute malnutrition due to reported inadequate intake for the past month. She does not meet any other criteria for malnutrition at this time as reported weight loss was not significant for time frame and NFPE did not find any fat or muscle wasting.  Diet Order:  Diet clear liquid Room service appropriate? Yes; Fluid consistency: Thin  Skin:  Reviewed, no issues  Last BM:  10/26/2016  Height:   Ht Readings from Last 1 Encounters:  10/26/16 5\' 4"  (1.626 m)    Weight:   Wt Readings from Last 1 Encounters:  10/27/16 256 lb 9.6 oz (116.4 kg)    Ideal Body Weight:  54.5 kg  BMI:  Body mass index is 44.05 kg/m.  Estimated Nutritional Needs:   Kcal:  8119-1478 (MSJ x 1.1-1.3)  Protein:  115-130 grams (1-1.2 grams/kg)  Fluid:  1.9-2.2 L/day  EDUCATION NEEDS:   No education needs identified at this time  Willey Blade, MS, RD, LDN Pager: [phone] After Hours Pager: [phone]

## 2016-10-27 NOTE — Consult Note (Signed)
Reason for Consult:LLE numbness Referring Physician: Leslye Peer  CC: LLE numbness  HPI: Taylor Ray is an 56 y.o. female who reports that in 2007-2008 she began to have numbness in her toes on the left foot.  Over the years this numbness has progressed to involve the sole of her left foot, up the back of her leg, into her buttocks and into her perineal area.  Had an MRI of the lumbar spine in 2008 and was told she had some degenerative disease at L2 and L5.  Has not seen a doctor since that time.    Past Medical History:  Diagnosis Date  . Diverticulitis     Past Surgical History:  Procedure Laterality Date  . ABLATION ON ENDOMETRIOSIS    . TONSILLECTOMY      History reviewed. No pertinent family history.  Social History:  reports that she has been smoking.  She has never used smokeless tobacco. Her alcohol and drug histories are not on file.  Allergies  Allergen Reactions  . Penicillins Other (See Comments)    sts allergy from childhood  . Benadryl [Diphenhydramine Hcl] Hives and Anxiety    Medications:  I have reviewed the patient's current medications. Prior to Admission:  Prescriptions Prior to Admission  Medication Sig Dispense Refill Last Dose  . naproxen sodium (ANAPROX) 220 MG tablet Take 220 mg by mouth 2 (two) times daily with a meal.   PRN at PRN  . oxyCODONE-acetaminophen (PERCOCET) 7.5-325 MG tablet Take 1 tablet by mouth every 4 (four) hours as needed for severe pain. 20 tablet 0 PRN at PRN  . ciprofloxacin (CIPRO) 500 MG tablet Take 1 tablet (500 mg total) by mouth 2 (two) times daily. (Patient not taking: Reported on 10/26/2016) 14 tablet 0 Completed Course at Unknown time  . metroNIDAZOLE (FLAGYL) 500 MG tablet Take 1 tablet (500 mg total) by mouth 2 (two) times daily after a meal. (Patient not taking: Reported on 10/26/2016) 14 tablet 0 Completed Course at Unknown time  . predniSONE (STERAPRED UNI-PAK 21 TAB) 10 MG (21) TBPK tablet Dispense steroid taper pack  as directed (Patient not taking: Reported on 10/26/2016) 21 tablet 0 Completed Course at Unknown time  . traMADol (ULTRAM) 50 MG tablet Take 1 tablet (50 mg total) by mouth every 6 (six) hours as needed. (Patient not taking: Reported on 10/26/2016) 20 tablet 0 Not Taking at Unknown time   Scheduled: . enoxaparin (LOVENOX) injection  40 mg Subcutaneous Q12H  . nicotine  21 mg Transdermal Daily    ROS: History obtained from the patient  General ROS: negative for - chills, fatigue, fever, night sweats, weight gain or weight loss Psychological ROS: negative for - behavioral disorder, hallucinations, memory difficulties, mood swings or suicidal ideation Ophthalmic ROS: negative for - blurry vision, double vision, eye pain or loss of vision ENT ROS: negative for - epistaxis, nasal discharge, oral lesions, sore throat, tinnitus or vertigo Allergy and Immunology ROS: negative for - hives or itchy/watery eyes Hematological and Lymphatic ROS: negative for - bleeding problems, bruising or swollen lymph nodes Endocrine ROS: negative for - galactorrhea, hair pattern changes, polydipsia/polyuria or temperature intolerance Respiratory ROS: negative for - cough, hemoptysis, shortness of breath or wheezing Cardiovascular ROS: negative for - chest pain, dyspnea on exertion, edema or irregular heartbeat Gastrointestinal ROS: abdominal pain Genito-Urinary ROS: negative for - dysuria, hematuria, incontinence or urinary frequency/urgency Musculoskeletal ROS: negative for - joint swelling or muscular weakness Neurological ROS: as noted in HPI Dermatological ROS: negative for  rash and skin lesion changes  Physical Examination: Blood pressure 130/63, pulse 82, temperature 97.3 F (36.3 C), temperature source Oral, resp. rate 18, height 5\' 4"  (1.626 m), weight 116.4 kg (256 lb 9.6 oz), SpO2 91 %.  HEENT-  Normocephalic, no lesions, without obvious abnormality.  Normal external eye and conjunctiva.  Normal TM's  bilaterally.  Normal auditory canals and external ears. Normal external nose, mucus membranes and septum.  Normal pharynx. Cardiovascular- S1, S2 normal, pulses palpable throughout   Lungs- chest clear, no wheezing, rales, normal symmetric air entry Abdomen- soft, non-tender; bowel sounds normal; no masses,  no organomegaly Extremities- no edema Lymph-no adenopathy palpable Musculoskeletal-no joint tenderness, deformity or swelling Skin-warm and dry, no hyperpigmentation, vitiligo, or suspicious lesions  Neurological Examination   Mental Status: Alert, oriented, thought content appropriate.  Speech fluent without evidence of aphasia.  Able to follow 3 step commands without difficulty. Cranial Nerves: II: Discs flat bilaterally; Visual fields grossly normal, pupils equal, round, reactive to light and accommodation III,IV, VI: ptosis not present, extra-ocular motions intact bilaterally V,VII: smile symmetric, facial light touch sensation normal bilaterally VIII: hearing normal bilaterally IX,X: gag reflex present XI: bilateral shoulder shrug XII: midline tongue extension Motor: Right : Upper extremity   5/5    Left:     Upper extremity   5/5  Lower extremity   5/5     Lower extremity   5/5 Tone and bulk:normal tone throughout; no atrophy noted Sensory: Pinprick and light touch decreased in the toes, lateral aspect of the left foot, sole of the foot and posterior portion of the left leg and into the buttocks.  No sciatic notch tenderness.  Mild lumbar tenderness to palpation.   Deep Tendon Reflexes: 2+ in the upper extremities and absent in the lower extremities Plantars: Right: mute   Left: mute Cerebellar: Normal finger-to-nose and normal heel-to-shin testing bilaterally Gait: not tested due to safety concerns    Laboratory Studies:   Basic Metabolic Panel:  Recent Labs Lab 10/26/16 0903 10/27/16 0529  NA 142 142  K 3.9 3.9  CL 108 110  CO2 23 28  GLUCOSE 104* 94  BUN 10 8   CREATININE 0.66 0.69  CALCIUM 9.3 8.5*    Liver Function Tests:  Recent Labs Lab 10/26/16 0903  AST 14*  ALT 11*  ALKPHOS 94  BILITOT 0.7  PROT 7.5  ALBUMIN 3.7    Recent Labs Lab 10/26/16 0903  LIPASE 22   No results for input(s): AMMONIA in the last 168 hours.  CBC:  Recent Labs Lab 10/26/16 0903 10/27/16 0529  WBC 9.5 7.0  HGB 12.1 11.0*  HCT 38.5 35.6  MCV 74.9* 76.2*  PLT 214 190    Cardiac Enzymes: No results for input(s): CKTOTAL, CKMB, CKMBINDEX, TROPONINI in the last 168 hours.  BNP: Invalid input(s): POCBNP  CBG:  Recent Labs Lab 10/27/16 0732  GLUCAP 46    Microbiology: No results found for this or any previous visit.  Coagulation Studies: No results for input(s): LABPROT, INR in the last 72 hours.  Urinalysis:  Recent Labs Lab 10/26/16 0903  COLORURINE AMBER*  LABSPEC 1.030  PHURINE 5.0  GLUCOSEU NEGATIVE  HGBUR NEGATIVE  BILIRUBINUR NEGATIVE  KETONESUR 20*  PROTEINUR 30*  NITRITE NEGATIVE  LEUKOCYTESUR NEGATIVE    Lipid Panel:     Component Value Date/Time   CHOL 210 (H) 10/27/2016 0529   TRIG 97 10/27/2016 0529   HDL 49 10/27/2016 0529   CHOLHDL 4.3 10/27/2016 0529  VLDL 19 10/27/2016 0529   LDLCALC 142 (H) 10/27/2016 0529    HgbA1C: No results found for: HGBA1C  Urine Drug Screen:  No results found for: LABOPIA, COCAINSCRNUR, LABBENZ, AMPHETMU, THCU, LABBARB  Alcohol Level: No results for input(s): ETH in the last 168 hours.   Imaging: Ct Abdomen Pelvis W Contrast  Result Date: 10/26/2016 CLINICAL DATA:  Lower abdominal pain with vomiting for 1 week. History diverticulitis. EXAM: CT ABDOMEN AND PELVIS WITH CONTRAST TECHNIQUE: Multidetector CT imaging of the abdomen and pelvis was performed using the standard protocol following bolus administration of intravenous contrast. CONTRAST:  <See Chart> ISOVUE-300 IOPAMIDOL (ISOVUE-300) INJECTION 61%, <See Chart> ISOVUE-300 IOPAMIDOL (ISOVUE-300) INJECTION 61%  COMPARISON:  10/01/2016 FINDINGS: Lower chest: Clear lung bases. Mild cardiomegaly, without pericardial or pleural effusion. Hepatobiliary: Normal liver. Normal gallbladder, without biliary ductal dilatation. Pancreas: Normal, without mass or ductal dilatation. Spleen: Normal in size, without focal abnormality. Adrenals/Urinary Tract: Normal adrenal glands. Upper pole left renal cyst and too small to characterize lesion. Normal right kidney. Normal urinary bladder. Stomach/Bowel: Normal stomach, without wall thickening. Colonic diverticulosis. Wall thickening of and inflammation surrounding the sigmoid are again identified. Minimally improved since 10/01/2016. Again identified is a collection within the posterior wall of the sigmoid which measures 2.1 cm and slightly greater than fluid density on image 71/series 2. This is similar to on the prior exam (when remeasured). No extraluminal gas or fluid collection. Normal terminal ileum and appendix. Vascular/Lymphatic: Aortic and branch vessel atherosclerosis. No abdominopelvic adenopathy. Reproductive: Normal uterus and adnexa. Other: No significant free fluid. Musculoskeletal: Degenerative disc disease at L5-S1. Grade 1 L3-4 anterolisthesis. IMPRESSION: 1. Recurrent or persistent diverticulitis involving the sigmoid. Similar appearance of a collection of hypoattenuation in the posterior sigmoid wall, suspicious for intramural phlegmon or developing abscess. 2.  Aortic atherosclerosis. Electronically Signed   By: Abigail Miyamoto M.D.   On: 10/26/2016 11:44     Assessment/Plan: 56 year old female presenting with abdominal pain, who also reports LLE numbness that has been present and progressive since 2007-8.  No weakness.  No bowel or bladder incontinence.  Has had a past MRI showing degenerative disease.  May very well be progressing.  Recommendations: 1.  MRI of the lumbar spine   Alexis Goodell, MD Neurology [phone] 10/27/2016, 10:43 AM

## 2016-10-28 DIAGNOSIS — M48061 Spinal stenosis, lumbar region without neurogenic claudication: Secondary | ICD-10-CM

## 2016-10-28 LAB — HEMOGLOBIN A1C
Hgb A1c MFr Bld: 5.1 % (ref 4.8–5.6)
Mean Plasma Glucose: 100 mg/dL

## 2016-10-28 LAB — HIV ANTIBODY (ROUTINE TESTING W REFLEX): HIV SCREEN 4TH GENERATION: NONREACTIVE

## 2016-10-28 LAB — GLUCOSE, CAPILLARY: Glucose-Capillary: 89 mg/dL (ref 65–99)

## 2016-10-28 MED ORDER — SENNOSIDES-DOCUSATE SODIUM 8.6-50 MG PO TABS
1.0000 | ORAL_TABLET | Freq: Two times a day (BID) | ORAL | Status: DC
  Administered 2016-10-28 – 2016-10-29 (×2): 1 via ORAL
  Filled 2016-10-28 (×2): qty 1

## 2016-10-28 NOTE — Consult Note (Signed)
Chief Complaint:  Numbness in left leg  History of Present Illness: Taylor Ray is a 56 y.o. female who presents with the chief complaint of left leg numbness for the past 10 years. She has had 2 episodes of bad sciatica, worst in 2012 but none recently.  She came in to the hospital today for diverticulitis and is on antibiotics.  She has some subjective weakness in her left leg. She describes numbness in her left posterior thigh to posterior calf to bottom of foot.  She reports 1 episode of fecal incontinence with a cough 2 months ago, but this is not routine. She has normal perineal sensation.  She is able to walk through her grocery shopping, but has used a cart or sat to take breaks at times. She also reports some back pain with ambulation.    She has not tried conservative management recently.  Taylor Ray has no symptoms of cervical myelopathy.  The symptoms are causing a significant impact on the patient's life.   Review of Systems:  A 10 point review of systems is negative, except for the pertinent positives and negatives detailed in the HPI.  Past Medical History: Past Medical History:  Diagnosis Date  . Diverticulitis     Past Surgical History: Past Surgical History:  Procedure Laterality Date  . ABLATION ON ENDOMETRIOSIS    . TONSILLECTOMY      Allergies: Allergies as of 10/26/2016 - Review Complete 10/26/2016  Allergen Reaction Noted  . Penicillins Other (See Comments) 09/25/2016  . Benadryl [diphenhydramine hcl] Hives and Anxiety 09/25/2016    Medications:  Current Facility-Administered Medications:  .  0.9 % NaCl with KCl 20 mEq/ L  infusion, , Intravenous, Continuous, Theodoro Grist, MD, Last Rate: 100 mL/hr at 10/28/16 1019 .  acetaminophen (TYLENOL) tablet 650 mg, 650 mg, Oral, Q6H PRN **OR** acetaminophen (TYLENOL) suppository 650 mg, 650 mg, Rectal, Q6H PRN, Theodoro Grist, MD .  enoxaparin (LOVENOX) injection 40 mg, 40 mg, Subcutaneous, Q12H,  Theodoro Grist, MD, 40 mg at 10/28/16 1021 .  feeding supplement (BOOST / RESOURCE BREEZE) liquid 1 Container, 1 Container, Oral, TID BM, Loletha Grayer, MD, 1 Container at 10/28/16 1400 .  hydrOXYzine (ATARAX/VISTARIL) tablet 25 mg, 25 mg, Oral, Q6H PRN, Saundra Shelling, MD, 25 mg at 10/26/16 2345 .  morphine 4 MG/ML injection 4 mg, 4 mg, Intravenous, Q4H PRN, Theodoro Grist, MD, 4 mg at 10/27/16 2011 .  nicotine (NICODERM CQ - dosed in mg/24 hours) patch 21 mg, 21 mg, Transdermal, Daily, Theodoro Grist, MD, 21 mg at 10/28/16 1020 .  ondansetron (ZOFRAN) tablet 4 mg, 4 mg, Oral, Q6H PRN **OR** ondansetron (ZOFRAN) injection 4 mg, 4 mg, Intravenous, Q6H PRN, Theodoro Grist, MD .  oxyCODONE-acetaminophen (PERCOCET) 7.5-325 MG per tablet 1 tablet, 1 tablet, Oral, Q4H PRN, Loletha Grayer, MD, 1 tablet at 10/28/16 1420 .  piperacillin-tazobactam (ZOSYN) IVPB 3.375 g, 3.375 g, Intravenous, Q8H, Lenis Noon, RPH, Last Rate: 12.5 mL/hr at 10/28/16 1421, 3.375 g at 10/28/16 1421 .  senna-docusate (Senokot-S) tablet 1 tablet, 1 tablet, Oral, BID, Theodoro Grist, MD   Social History: Social History  Substance Use Topics  . Smoking status: Current Some Day Smoker  . Smokeless tobacco: Never Used  . Alcohol use Not on file    Family Medical History: History reviewed. No pertinent family history.  Physical Examination: Vitals:   10/28/16 0740 10/28/16 1252  BP: (!) 143/75 120/85  Pulse: 73 71  Resp: 18   Temp: 98.8 F (  37.1 C) 98.8 F (37.1 C)     General: Patient is well developed, well nourished, calm, collected, and in no apparent distress.  Psychiatric: Patient is non-anxious.  Head:  Pupils equal, round, and reactive to light.  ENT:  Oral mucosa appears well hydrated.  Neck:   Supple.  Full range of motion.  Respiratory: Patient is breathing without any difficulty.  Extremities: No edema.  Vascular: Palpable pulses in dorsal pedal vessels.  Skin:   On exposed skin, there are no  abnormal skin lesions.  NEUROLOGICAL:  General: In no acute distress.   Awake, alert, oriented to person, place, and time.  Pupils equal round and reactive to light.  Facial tone is symmetric.  Tongue protrusion is midline.  There is no pronator drift.   Strength: Side Biceps Triceps Deltoid Interossei Grip Wrist Ext. Wrist Flex.  R 5 5 5 5 5 5 5   L 5 5 5 5 5 5 5    Side Iliopsoas Quads Hamstring PF DF EHL  R 5 5 5 5 5 5   L 5 5 5 5 5 5    Reflexes are 1+ and symmetric at the biceps, triceps, brachioradialis, patella and achilles.   Bilateral upper and lower extremity sensation is intact to light touch except in L L5 and S1 dermatomes.  Clonus is not present.  Toes are down-going.  Gait is normal.  No difficulty with tandem gait.  Hoffman's is absent.  Imaging: CLINICAL DATA:  Chronic numbness in the toes of the left foot which has progressed since 2007 and now involves the entire left foot and posterior aspect of the left leg, buttocks and perineum. No known injury.  EXAM: MRI LUMBAR SPINE WITHOUT CONTRAST  TECHNIQUE: Multiplanar, multisequence MR imaging of the lumbar spine was performed. No intravenous contrast was administered.  COMPARISON:  CT abdomen and pelvis 10/26/2016. MRI lumbar spine 12/21/2012.  FINDINGS: Segmentation:  Standard.  Alignment: 0.4 cm anterolisthesis L3 on L4 due to facet degenerative disease is slightly increased since the prior MRI.  Vertebrae: No fracture or worrisome lesion. Degenerative endplate signal change at L5-S1 is identified and appears worse than on the prior MRI.  Conus medullaris: Extends to the T12-L1 level and appears normal.  Paraspinal and other soft tissues: Small T2 hyperintense lesion left kidney is most compatible with a cyst. Otherwise negative.  Disc levels:  T10-11 and T11-12 are imaged in the sagittal plane only. Minimal disc bulging is seen at both levels without central canal or foraminal  stenosis.  T12-L1:  Negative.  L1-2:  Mild facet degenerative disease.  Otherwise negative.  L2-3: Mild to moderate facet degenerative change and ligamentum flavum thickening. There is a minimal disc bulge but the central canal and foramina are open. Spondylosis shows mild progression since the prior MRI.  L3-4: Moderate facet arthropathy and ligamentum flavum thickening are seen. The disc is uncovered with a shallow bulge. Mild to moderate central canal stenosis has slightly progressed since the prior MRI. The foramina are open.  L4-5: Advanced bilateral facet degenerative disease and ligamentum flavum thickening have progressed since the prior examination. Focal ligamentum flavum thickening and/or osteophytosis off the medial margin of both facet joints is new since the prior study. There is a shallow disc bulge. Moderately severe central canal stenosis is present with narrowing of both subarticular recesses, worse on the left where the descending left L5 root appears compressed. There is mild bilateral foraminal narrowing.  L5-S1: There is facet degenerative disease and some ligamentum flavum thickening.  The central canal is open. Moderate to moderately severe bilateral foraminal narrowing has progressed since the prior exam.  IMPRESSION: Progressive spondylosis worst at L4-5 where there is moderately severe central canal narrowing and left worse than right subarticular recess narrowing predominant due to bulky ligamentum flavum thickening and facet arthropathy. Shallow disc bulge is present at this level. The descending left L5 root appears compressed in the subarticular recess.  Slight progression moderate central canal stenosis at L3-4 where facet degenerative disease results in 0.4 cm anterolisthesis.   Electronically Signed   By: Inge Rise M.D.   On: 10/27/2016 12:53  I have personally reviewed the images and agree with the above interpretation.  My only addition is there is a finding on T2 sagittal imaging at L4-5 that appears to be intradural, but is inconclusive in appearance.  Assessment and Plan: Taylor Ray is a pleasant 56 y.o. female with diverticulitis who also has long history of apparent lumbar radiculopathy. She has severe spinal stenosis at L4-5 and left lateral recess stenosis at L5-S1.  There is also anterolisthesis of L3 on L4. I am unsure whether the radiographic finding at L4-5 is a cyst, disc herniation, tumor, or other mass lesion.  I have discussed the condition with the patient, including showing the radiographs and discussing treatment options in layman's terms.  The patient may benefit from conservative management.  Thus, I have recommended the following: MRI lumbar spine with and without contrast, flexion and extension lumbar radiographs (4+ views including AP and Lateral), physical therapy, weight loss.    I am happy to see Taylor Ray in clinic at [facility] after her diverticulitis has resolved. Please schedule her follow-up by contacting my RN, Berdine Addison, at [phone].   Thank you for involving me in the care of this patient. I will keep you apprised of the patient's progress.   This note was partially dictated using voice recognition software, so please excuse any errors that were not corrected.   Osceola Holian K. Izora Ribas MD, [place] [facility]

## 2016-10-28 NOTE — Progress Notes (Signed)
Subjective: No new neurological complaints.  Did well with therapy this morning.    Objective: Current vital signs: BP (!) 143/75 (BP Location: Left Arm)   Pulse 73   Temp 98.8 F (37.1 C) (Oral)   Resp 18   Ht 5\' 4"  (1.626 m)   Wt 116.9 kg (257 lb 11.2 oz)   SpO2 100%   BMI 44.23 kg/m  Vital signs in last 24 hours: Temp:  [97.7 F (36.5 C)-99.7 F (37.6 C)] 98.8 F (37.1 C) (05/02 0740) Pulse Rate:  [70-84] 73 (05/02 0740) Resp:  [18] 18 (05/02 0740) BP: (109-150)/(63-80) 143/75 (05/02 0740) SpO2:  [99 %-100 %] 100 % (05/02 0425) Weight:  [116.9 kg (257 lb 11.2 oz)] 116.9 kg (257 lb 11.2 oz) (05/02 0430)  Intake/Output from previous day: 05/01 0701 - 05/02 0700 In: 0086 [P.O.:2060; I.V.:2650; IV Piggyback:125] Out: 3300 [Urine:3300] Intake/Output this shift: Total I/O In: 760 [P.O.:760] Out: 300 [Urine:300] Nutritional status: Diet clear liquid Room service appropriate? Yes; Fluid consistency: Thin  Neurologic Exam: Mental Status: Alert, oriented, thought content appropriate.  Speech fluent without evidence of aphasia.  Able to follow 3 step commands without difficulty. Cranial Nerves: II: Discs flat bilaterally; Visual fields grossly normal, pupils equal, round, reactive to light and accommodation III,IV, VI: ptosis not present, extra-ocular motions intact bilaterally V,VII: smile symmetric, facial light touch sensation normal bilaterally VIII: hearing normal bilaterally IX,X: gag reflex present XI: bilateral shoulder shrug XII: midline tongue extension Motor: Right :  Upper extremity   5/5                                      Left:     Upper extremity   5/5             Lower extremity   5/5                                                  Lower extremity   5/5 Tone and bulk:normal tone throughout; no atrophy noted Sensory: Pinprick and light touch decreased in the toes, lateral aspect of the left foot, sole of the foot and posterior portion of the left leg and  into the buttocks.  No sciatic notch tenderness.  Mild lumbar tenderness to palpation.   Deep Tendon Reflexes: 2+ in the upper extremities and absent in the lower extremities   Lab Results: Basic Metabolic Panel:  Recent Labs Lab 10/26/16 0903 10/27/16 0529  NA 142 142  K 3.9 3.9  CL 108 110  CO2 23 28  GLUCOSE 104* 94  BUN 10 8  CREATININE 0.66 0.69  CALCIUM 9.3 8.5*    Liver Function Tests:  Recent Labs Lab 10/26/16 0903  AST 14*  ALT 11*  ALKPHOS 94  BILITOT 0.7  PROT 7.5  ALBUMIN 3.7    Recent Labs Lab 10/26/16 0903  LIPASE 22   No results for input(s): AMMONIA in the last 168 hours.  CBC:  Recent Labs Lab 10/26/16 0903 10/27/16 0529  WBC 9.5 7.0  HGB 12.1 11.0*  HCT 38.5 35.6  MCV 74.9* 76.2*  PLT 214 190    Cardiac Enzymes: No results for input(s): CKTOTAL, CKMB, CKMBINDEX, TROPONINI in the last 168 hours.  Lipid Panel:  Recent Labs Lab  10/27/16 0529  CHOL 210*  TRIG 97  HDL 49  CHOLHDL 4.3  VLDL 19  LDLCALC 142*    CBG:  Recent Labs Lab 10/27/16 0732 10/28/16 0728  GLUCAP 75 89    Microbiology: No results found for this or any previous visit.  Coagulation Studies: No results for input(s): LABPROT, INR in the last 72 hours.  Imaging: Mr Lumbar Spine Wo Contrast  Result Date: 10/27/2016 CLINICAL DATA:  Chronic numbness in the toes of the left foot which has progressed since 2007 and now involves the entire left foot and posterior aspect of the left leg, buttocks and perineum. No known injury. EXAM: MRI LUMBAR SPINE WITHOUT CONTRAST TECHNIQUE: Multiplanar, multisequence MR imaging of the lumbar spine was performed. No intravenous contrast was administered. COMPARISON:  CT abdomen and pelvis 10/26/2016. MRI lumbar spine 12/21/2012. FINDINGS: Segmentation:  Standard. Alignment: 0.4 cm anterolisthesis L3 on L4 due to facet degenerative disease is slightly increased since the prior MRI. Vertebrae: No fracture or worrisome lesion.  Degenerative endplate signal change at L5-S1 is identified and appears worse than on the prior MRI. Conus medullaris: Extends to the T12-L1 level and appears normal. Paraspinal and other soft tissues: Small T2 hyperintense lesion left kidney is most compatible with a cyst. Otherwise negative. Disc levels: T10-11 and T11-12 are imaged in the sagittal plane only. Minimal disc bulging is seen at both levels without central canal or foraminal stenosis. T12-L1:  Negative. L1-2:  Mild facet degenerative disease.  Otherwise negative. L2-3: Mild to moderate facet degenerative change and ligamentum flavum thickening. There is a minimal disc bulge but the central canal and foramina are open. Spondylosis shows mild progression since the prior MRI. L3-4: Moderate facet arthropathy and ligamentum flavum thickening are seen. The disc is uncovered with a shallow bulge. Mild to moderate central canal stenosis has slightly progressed since the prior MRI. The foramina are open. L4-5: Advanced bilateral facet degenerative disease and ligamentum flavum thickening have progressed since the prior examination. Focal ligamentum flavum thickening and/or osteophytosis off the medial margin of both facet joints is new since the prior study. There is a shallow disc bulge. Moderately severe central canal stenosis is present with narrowing of both subarticular recesses, worse on the left where the descending left L5 root appears compressed. There is mild bilateral foraminal narrowing. L5-S1: There is facet degenerative disease and some ligamentum flavum thickening. The central canal is open. Moderate to moderately severe bilateral foraminal narrowing has progressed since the prior exam. IMPRESSION: Progressive spondylosis worst at L4-5 where there is moderately severe central canal narrowing and left worse than right subarticular recess narrowing predominant due to bulky ligamentum flavum thickening and facet arthropathy. Shallow disc bulge is  present at this level. The descending left L5 root appears compressed in the subarticular recess. Slight progression moderate central canal stenosis at L3-4 where facet degenerative disease results in 0.4 cm anterolisthesis. Electronically Signed   By: Inge Rise M.D.   On: 10/27/2016 12:53    Medications:  I have reviewed the patient's current medications. Scheduled: . enoxaparin (LOVENOX) injection  40 mg Subcutaneous Q12H  . feeding supplement  1 Container Oral TID BM  . nicotine  21 mg Transdermal Daily    Assessment/Plan: Patient stable.  MRI of the lumbar spine reviewed and shows progressive spondylosis worse at L4-5 with associated moderately severe spinal stenosis and subarticular narrowing.  Patient should be evaluated for possible surgical intervention but due to the patient's duration of symptoms, lack of motor involvement  and stability of symptoms I do not suspect that this will be required urgently.    Recommendations: 1.  Neurosurgical consult.     LOS: 2 days   Alexis Goodell, MD Neurology [phone] 10/28/2016  12:46 PM

## 2016-10-28 NOTE — Progress Notes (Signed)
Patient ID: [name], female   DOB: [birth year], 56 y.o.   MRN: [id number]  [employer] PROGRESS NOTE  Taylor Ray WVP:[id number] DOB: 07/18/1960 DOA: 10/26/2016 PCP: No PCP Per Patient  HPI/Subjective: Patient's abdominal pain is subsiding, mostly concentrated on the left side of the abdomen, no bowel movements, patient is on clear liquid diet at present, being continued on Zosyn, MAXIMUM TEMPERATURE 99.7 yesterday, white blood cell count has improved. Some numbness in left lower extremity, able to ambulate, seen by physical therapist, recommended outpatient physical therapy. Patient was seen by neurologist, neurosurgical consultation was recommended.  Objective: Vitals:   10/28/16 0740 10/28/16 1252  BP: (!) 143/75 120/85  Pulse: 73 71  Resp: 18   Temp: 98.8 F (37.1 C) 98.8 F (37.1 C)    Filed Weights   10/26/16 0851 10/27/16 0532 10/28/16 0430  Weight: 117.9 kg (260 lb) 116.4 kg (256 lb 9.6 oz) 116.9 kg (257 lb 11.2 oz)    ROS: Review of Systems  Constitutional: Negative for chills and fever.  Eyes: Negative for blurred vision.  Respiratory: Negative for cough and shortness of breath.   Cardiovascular: Negative for chest pain.  Gastrointestinal: Positive for abdominal pain. Negative for constipation, diarrhea, nausea and vomiting.  Genitourinary: Negative for dysuria.  Musculoskeletal: Negative for joint pain.  Neurological: Positive for sensory change. Negative for dizziness and headaches.   Exam: Physical Exam    Data Reviewed: Basic Metabolic Panel:  Recent Labs Lab 10/26/16 0903 10/27/16 0529  NA 142 142  K 3.9 3.9  CL 108 110  CO2 23 28  GLUCOSE 104* 94  BUN 10 8  CREATININE 0.66 0.69  CALCIUM 9.3 8.5*   Liver Function Tests:  Recent Labs Lab 10/26/16 0903  AST 14*  ALT 11*  ALKPHOS 94  BILITOT 0.7  PROT 7.5  ALBUMIN 3.7    Recent Labs Lab 10/26/16 0903  LIPASE 22   CBC:  Recent Labs Lab 10/26/16 0903  10/27/16 0529  WBC 9.5 7.0  HGB 12.1 11.0*  HCT 38.5 35.6  MCV 74.9* 76.2*  PLT 214 190    CBG:  Recent Labs Lab 10/27/16 0732 10/28/16 0728  GLUCAP 75 89      Studies: Mr Lumbar Spine Wo Contrast  Result Date: 10/27/2016 CLINICAL DATA:  Chronic numbness in the toes of the left foot which has progressed since 2007 and now involves the entire left foot and posterior aspect of the left leg, buttocks and perineum. No known injury. EXAM: MRI LUMBAR SPINE WITHOUT CONTRAST TECHNIQUE: Multiplanar, multisequence MR imaging of the lumbar spine was performed. No intravenous contrast was administered. COMPARISON:  CT abdomen and pelvis 10/26/2016. MRI lumbar spine 12/21/2012. FINDINGS: Segmentation:  Standard. Alignment: 0.4 cm anterolisthesis L3 on L4 due to facet degenerative disease is slightly increased since the prior MRI. Vertebrae: No fracture or worrisome lesion. Degenerative endplate signal change at L5-S1 is identified and appears worse than on the prior MRI. Conus medullaris: Extends to the T12-L1 level and appears normal. Paraspinal and other soft tissues: Small T2 hyperintense lesion left kidney is most compatible with a cyst. Otherwise negative. Disc levels: T10-11 and T11-12 are imaged in the sagittal plane only. Minimal disc bulging is seen at both levels without central canal or foraminal stenosis. T12-L1:  Negative. L1-2:  Mild facet degenerative disease.  Otherwise negative. L2-3: Mild to moderate facet degenerative change and ligamentum flavum thickening. There is a minimal disc bulge but the central canal and foramina are open. Spondylosis  shows mild progression since the prior MRI. L3-4: Moderate facet arthropathy and ligamentum flavum thickening are seen. The disc is uncovered with a shallow bulge. Mild to moderate central canal stenosis has slightly progressed since the prior MRI. The foramina are open. L4-5: Advanced bilateral facet degenerative disease and ligamentum flavum  thickening have progressed since the prior examination. Focal ligamentum flavum thickening and/or osteophytosis off the medial margin of both facet joints is new since the prior study. There is a shallow disc bulge. Moderately severe central canal stenosis is present with narrowing of both subarticular recesses, worse on the left where the descending left L5 root appears compressed. There is mild bilateral foraminal narrowing. L5-S1: There is facet degenerative disease and some ligamentum flavum thickening. The central canal is open. Moderate to moderately severe bilateral foraminal narrowing has progressed since the prior exam. IMPRESSION: Progressive spondylosis worst at L4-5 where there is moderately severe central canal narrowing and left worse than right subarticular recess narrowing predominant due to bulky ligamentum flavum thickening and facet arthropathy. Shallow disc bulge is present at this level. The descending left L5 root appears compressed in the subarticular recess. Slight progression moderate central canal stenosis at L3-4 where facet degenerative disease results in 0.4 cm anterolisthesis. Electronically Signed   By: Inge Rise M.D.   On: 10/27/2016 12:53    Scheduled Meds: . enoxaparin (LOVENOX) injection  40 mg Subcutaneous Q12H  . feeding supplement  1 Container Oral TID BM  . nicotine  21 mg Transdermal Daily  . senna-docusate  1 tablet Oral BID   Continuous Infusions: . 0.9 % NaCl with KCl 20 mEq / L 100 mL/hr at 10/28/16 1019  . piperacillin-tazobactam (ZOSYN)  IV Stopped (10/28/16 1021)    Assessment/Plan:   1. Diverticulitis of colon with abscess formation. Patient failed outpatient therapy. Continue intravenous Zosyn , white blood cell count has improved, MAXIMUM TEMPERATURE 99.7 yesterday afternoon. Appreciate surgery following. Need to watch closely. Will likely end up needing an outpatient colonoscopy once this settles down. Continue clear liquid diet 2. Lumbar  radiculopathy with spinal stenosis and Chronic left leg numbness. Power intact. Physical therapist evaluate the patient, recommended outpatient physical therapy. Patient was seen by neurologist, recommended neurosurgical consultation. MRI revealed spondylosis, L4-5, moderate to severe central canal narrowing, disc bulge. 3. Tobacco abuse on nicotine patch 4. Obesity. Weight loss needed. LDL was found to be 142, triglycerides 97, TSH 0.7, hemoglobin A1c 5.1  Code Status:     Code Status Orders        Start     Ordered   10/26/16 1428  Full code  Continuous     10/26/16 1427    Code Status History    Date Active Date Inactive Code Status Order ID Comments User Context   This patient has a current code status but no historical code status.      Disposition Plan: Will likely need IV antibiotics for a period of time and then reevaluation with CT scan  Consultants:  Surgery  Neurology  Antibiotics:  Zosyn  Time spent: 30 minutes  [employer]

## 2016-10-28 NOTE — Evaluation (Signed)
Physical Therapy Evaluation and Discharge Patient Details Name: Taylor Ray MRN: [id number] DOB: [birth year] Today's Date: 10/28/2016   History of Present Illness  Pt is a 56 y/o F who presented with c/o L lower quadrant abdominal pain.  Pt also reports long h/o LLE numbness/tingling, including saddle paresthesia (since 2007).  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 Pt admitted with diverticulitis of colon with abscess formation.  Pt's PMH includes diverticulitis, chronic LLE numbness/tingling, obesity.    Clinical Impression  Pt admitted with above diagnosis. She presents with impaired sensation LLE as well as saddle paresthesia which Neurologist is aware of.  She demonstrates mild weakness in LLE but functionally is independent with all aspects of mobility.  Her symptoms would be best treated in the outpatient setting and thus recommending OPPT at d/c as she will need consistent therapy to make progress.  Will sign off acutely.  Thank you for this order.    Follow Up Recommendations Outpatient PT    Equipment Recommendations  None recommended by PT    Recommendations for Other Services       Precautions / Restrictions Precautions Precautions: None Restrictions Weight Bearing Restrictions: No      Mobility  Bed Mobility               General bed mobility comments: Pt sitting EOB upon PT arrival  Transfers Overall transfer level: Independent Equipment used: None             General transfer comment: No cues or physical assist needed.  Pt steady.  Ambulation/Gait Ambulation/Gait assistance: Independent Ambulation Distance (Feet): 225 Feet Assistive device: None Gait Pattern/deviations:  WFL(Within Functional Limits)   Gait velocity interpretation: at or above normal speed for age/gender General Gait Details: No gait abnormalities noted.  Pt steady, even with higher level balance activities.  Stairs            Wheelchair Mobility    Modified Rankin (Stroke Patients Only)       Balance Overall balance assessment: Independent                           High level balance activites: Turns;Sudden stops;Head turns;Direction changes High Level Balance Comments: No instability noted with higher level balance activities             Pertinent Vitals/Pain Pain Assessment: No/denies pain    Home Living Family/patient expects to be discharged to:: Private residence Living Arrangements: Alone Available Help at Discharge: Friend(s);Available PRN/intermittently Type of Home: House Home Access: Stairs to enter Entrance Stairs-Rails: Left;Right;Can reach both Entrance Stairs-Number of Steps: 2 Home Layout: One level Home Equipment: None      Prior Function Level of Independence: Independent         Comments: 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  She reports two falls in the past few years down her steps when it was raining.  She does not ambulate with AD.  Ind with all aspects of mobility and ind with ADLs and IADLs.  She is very active, enjoys Health and safety inspector.     Hand Dominance   Dominant Hand: Right    Extremity/Trunk Assessment   Upper  Extremity Assessment Upper Extremity Assessment: Overall WFL for tasks assessed    Lower Extremity Assessment Lower Extremity Assessment: LLE deficits/detail LLE Deficits / Details: Strength grossly 4/5, except hip F 3+/5 LLE Sensation: decreased light touch (numbness posterior LLE, gluteal region, saddale paresthesia)       Communication   Communication: No difficulties  Cognition Arousal/Alertness: Awake/alert Behavior During Therapy: WFL for tasks  assessed/performed Overall Cognitive Status: Within Functional Limits for tasks assessed                                        General Comments General comments (skin integrity, edema, etc.): Emphasized tot he pt the importance of addressing LLE symptoms with OPPT as she wil require consistent intervention for improvement, pt understanding but with financial limitations.    Exercises     Assessment/Plan    PT Assessment All further PT needs can be met in the next venue of care  PT Problem List Decreased strength;Impaired sensation       PT Treatment Interventions      PT Goals (Current goals can be found in the Care Plan section)  Acute Rehab PT Goals Patient Stated Goal: to go home  PT Goal Formulation: All assessment and education complete, DC therapy    Frequency     Barriers to discharge        Co-evaluation               AM-PAC PT "6 Clicks" Daily Activity  Outcome Measure Difficulty turning over in bed (including adjusting bedclothes, sheets and blankets)?: None Difficulty moving from lying on back to sitting on the side of the bed? : None Difficulty sitting down on and standing up from a chair with arms (e.g., wheelchair, bedside commode, etc,.)?: None Help needed moving to and from a bed to chair (including a wheelchair)?: None Help needed walking in hospital room?: None Help needed climbing 3-5 steps with a railing? : None 6 Click Score: 24    End of Session Equipment Utilized During Treatment: Gait belt Activity Tolerance: Patient tolerated treatment well Patient left: in bed;with call bell/phone within reach Nurse Communication: Mobility status PT Visit Diagnosis: Muscle weakness (generalized) (M62.81)    Time: 8338-2505 PT Time Calculation (min) (ACUTE ONLY): 24 min   Charges:   PT Evaluation $PT Eval Low Complexity: 1 Procedure PT Treatments $Gait Training: 8-22 mins   PT G CodesCollie Siad PT,  DPT 10/28/2016, 11:59 AM

## 2016-10-28 NOTE — Progress Notes (Signed)
SURGICAL PROGRESS NOTE (cpt [phone])  Hospital Day(s): 2.   Post op day(s):  Marland Kitchen   Interval History: Patient seen and examined, no acute events or new complaints overnight. Patient reports large amount of flatus this morning and pain continues to improve (still there, but now she "has to look" to find painful site), denies N/V, fever/chills, CP, or SOB.  Review of Systems:  Constitutional: denies fever, chills  HEENT: denies cough or congestion  Respiratory: denies any shortness of breath  Cardiovascular: denies chest pain or palpitations  Gastrointestinal: abdominal pain, N/V, and bowel function as per interval history Genitourinary: denies burning with urination or urinary frequency Musculoskeletal: denies pain, decreased motor or sensation Integumentary: denies any other rashes or skin discolorations Neurological: denies HA or vision/hearing changes   Vital signs in last 24 hours: [min-max] current  Temp:  [97.7 F (36.5 C)-99.7 F (37.6 C)] 98.4 F (36.9 C) (05/02 0425) Pulse Rate:  [70-84] 70 (05/02 0427) Resp:  [18] 18 (05/01 2025) BP: (109-150)/(63-80) 141/63 (05/02 0427) SpO2:  [99 %-100 %] 100 % (05/02 0425) Weight:  [257 lb 11.2 oz (116.9 kg)] 257 lb 11.2 oz (116.9 kg) (05/02 0430)     Height: 5\' 4"  (162.6 cm) Weight: 257 lb 11.2 oz (116.9 kg) BMI (Calculated): 44.7   Intake/Output this shift:  No intake/output data recorded.   Intake/Output last 2 shifts:  @IOLAST2SHIFTS @   Physical Exam:  Constitutional: alert, cooperative and no distress  HENT: normocephalic without obvious abnormality  Eyes: PERRL, EOM's grossly intact and symmetric  Neuro: CN II - XII grossly intact and symmetric without deficit  Respiratory: breathing non-labored at rest  Cardiovascular: regular rate and sinus rhythm  Gastrointestinal: soft, minimal focal Left of supra-pubic abdominal tenderness with deep palpation, and non-distended  Musculoskeletal: UE and LE FROM, motor grossly intact,  NT   Labs:  CBC Latest Ref Rng & Units 10/27/2016 10/26/2016 10/01/2016  WBC 3.6 - 11.0 K/uL 7.0 9.5 6.0  Hemoglobin 12.0 - 16.0 g/dL 11.0(L) 12.1 13.1  Hematocrit 35.0 - 47.0 % 35.6 38.5 42.0  Platelets 150 - 440 K/uL 190 214 231   BMP:  Lab Results  Component Value Date   GLUCOSE 94 10/27/2016   CO2 28 10/27/2016   BUN 8 10/27/2016   CREATININE 0.69 10/27/2016   CALCIUM 8.5 (L) 10/27/2016     Imaging studies: No new pertinent imaging studies   Assessment/Plan: (ICD-10's: K57.32) 56 y.o.femalewith improving incompletely treated sigmoid colonic diverticulitisconsidering ~2 cm intramural phlegmon, complicated by pertinent comorbidities including obesity (BMI 45), former tobacco abuse, and alack of routine and preventative medical care due to chronic uninsured status.  - advanced to soft mechanical diet - Zosyn IV antibiotics for persistent/recurrent acute diverticulitis with phlegmon - advise antibiotic therapy x 2 weeks considering sigmoid phlegmon - no indication for urgent surgical intervention at this time - monitor bowel function and abdominal exam - will continue to follow with primary team - DVT prophylaxis  All of the above findings and recommendations were discussed with the patient, and all of patient's questions were answered to her expressed satisfaction.  Thank you for the opportunity to participate in this patient's care.   -- Marilynne Drivers Rosana Hoes, MD, [facility] - Partnering for exceptional care. Office: [phone]

## 2016-10-29 DIAGNOSIS — M5416 Radiculopathy, lumbar region: Secondary | ICD-10-CM

## 2016-10-29 DIAGNOSIS — M48061 Spinal stenosis, lumbar region without neurogenic claudication: Secondary | ICD-10-CM

## 2016-10-29 DIAGNOSIS — K5732 Diverticulitis of large intestine without perforation or abscess without bleeding: Secondary | ICD-10-CM

## 2016-10-29 DIAGNOSIS — Z716 Tobacco abuse counseling: Secondary | ICD-10-CM

## 2016-10-29 HISTORY — DX: Spinal stenosis, lumbar region without neurogenic claudication: M48.061

## 2016-10-29 HISTORY — DX: Radiculopathy, lumbar region: M54.16

## 2016-10-29 HISTORY — DX: Tobacco abuse counseling: Z71.6

## 2016-10-29 LAB — GLUCOSE, CAPILLARY: GLUCOSE-CAPILLARY: 91 mg/dL (ref 65–99)

## 2016-10-29 MED ORDER — SIMETHICONE 80 MG PO CHEW
80.0000 mg | CHEWABLE_TABLET | Freq: Four times a day (QID) | ORAL | Status: DC
  Administered 2016-10-29: 80 mg via ORAL
  Filled 2016-10-29: qty 1

## 2016-10-29 MED ORDER — HYDROXYZINE HCL 25 MG PO TABS: 25.0000 mg | ORAL_TABLET | Freq: Four times a day (QID) | ORAL | 0 refills | Status: DC | PRN

## 2016-10-29 MED ORDER — BOOST / RESOURCE BREEZE PO LIQD: 1.0000 | Freq: Three times a day (TID) | ORAL | 0 refills | Status: DC

## 2016-10-29 MED ORDER — NICOTINE 21 MG/24HR TD PT24: 21.0000 mg | MEDICATED_PATCH | Freq: Every day | TRANSDERMAL | 0 refills | Status: DC

## 2016-10-29 MED ORDER — OXYCODONE-ACETAMINOPHEN 7.5-325 MG PO TABS: 1.0000 | ORAL_TABLET | ORAL | 0 refills | Status: DC | PRN

## 2016-10-29 MED ORDER — POLYETHYLENE GLYCOL 3350 17 G PO PACK
17.0000 g | PACK | Freq: Every day | ORAL | Status: DC
  Administered 2016-10-29: 17 g via ORAL
  Filled 2016-10-29: qty 1

## 2016-10-29 MED ORDER — AMOXICILLIN-POT CLAVULANATE 875-125 MG PO TABS: 1.0000 | ORAL_TABLET | Freq: Two times a day (BID) | ORAL | 0 refills | Status: DC

## 2016-10-29 MED ORDER — POLYETHYLENE GLYCOL 3350 17 G PO PACK: 17.0000 g | PACK | Freq: Every day | ORAL | 0 refills | Status: DC

## 2016-10-29 MED ORDER — SIMETHICONE 80 MG PO CHEW: 80.0000 mg | CHEWABLE_TABLET | Freq: Four times a day (QID) | ORAL | 0 refills | Status: DC

## 2016-10-29 NOTE — Discharge Instructions (Signed)

## 2016-10-29 NOTE — Progress Notes (Signed)
Pharmacy Antibiotic Note  Taylor Ray is a 56 y.o. female admitted on 10/26/2016 with IAI.  Pharmacy has been consulted for piperacillin/tazobactam dosing.  Patient has a listed PCN allergy on profile listed as an allergy from childhood with no specified reaction. Patient received a dose of piperacillin/tazobactam in the ED, confirmed no signs/symptoms of allergic reaction before completing consult. Per RN, no signs or symptoms of allergic reaction at this time.  Plan: Will continue Piperacillin/tazobactam 3.375 g IV q8h EI  Height: 5\' 4"  (162.6 cm) Weight: 257 lb 11.2 oz (116.9 kg) IBW/kg (Calculated) : 54.7  Temp (24hrs), Avg:98.6 F (37 C), Min:98.1 F (36.7 C), Max:99 F (37.2 C)   Recent Labs Lab 10/26/16 0903 10/27/16 0529  WBC 9.5 7.0  CREATININE 0.66 0.69    Estimated Creatinine Clearance: 98.7 mL/min (by C-G formula based on SCr of 0.69 mg/dL).    Allergies  Allergen Reactions  . Penicillins Other (See Comments)    sts allergy from childhood  . Benadryl [Diphenhydramine Hcl] Hives and Anxiety   Antimicrobials this admission: Piperacillin/tazobactam 4/30 >>   Dose adjustments this admission:  Microbiology results: None  Thank you for allowing pharmacy to be a part of this patient's care.  Pernell Dupre, PharmD, BCPS Clinical Pharmacist 10/29/2016 9:42 AM

## 2016-10-29 NOTE — Progress Notes (Signed)
SURGICAL PROGRESS NOTE (cpt [id number])  Hospital Day(s): 3.   Post op day(s):  Marland Kitchen   Interval History: Patient seen and examined, no acute events or new complaints overnight. Patient reports near-resolution of abdominal pain, +flatus, +BM, tolerating diet, ambulating, and denies fever/chills, N/V, CP, or SOB.  Review of Systems:  Constitutional: denies fever, chills  HEENT: denies cough or congestion  Respiratory: denies any shortness of breath  Cardiovascular: denies chest pain or palpitations  Gastrointestinal: abdominal pain, N/V, and bowel function as per interval history Genitourinary: denies burning with urination or urinary frequency Musculoskeletal: denies pain, decreased motor or sensation Integumentary: denies any other rashes or skin discolorations  Neurological: denies HA or vision/hearing changes   Vital signs in last 24 hours: [min-max] current  Temp:  [98.1 F (36.7 C)-99 F (37.2 C)] 98.1 F (36.7 C) (05/03 0501) Pulse Rate:  [61-73] 61 (05/03 0501) Resp:  [18-20] 20 (05/03 0501) BP: (120-143)/(73-86) 141/86 (05/03 0501) SpO2:  [97 %-100 %] 100 % (05/03 0501)     Height: 5\' 4"  (162.6 cm) Weight: 257 lb 11.2 oz (116.9 kg) BMI (Calculated): 44.7   Intake/Output this shift:  No intake/output data recorded.   Intake/Output last 2 shifts:  @IOLAST2SHIFTS @   Physical Exam:  Constitutional: alert, cooperative and no distress  HENT: normocephalic without obvious abnormality  Eyes: PERRL, EOM's grossly intact and symmetric  Neuro: CN II - XII grossly intact and symmetric without deficit  Respiratory: breathing non-labored at rest  Cardiovascular: regular rate and sinus rhythm Gastrointestinal: soft, non-tender, and non-distended Musculoskeletal: UE and LE FROM, motor and sensation grossly intact, NT  Labs:  CBC Latest Ref Rng & Units 10/27/2016 10/26/2016 10/01/2016  WBC 3.6 - 11.0 K/uL 7.0 9.5 6.0  Hemoglobin 12.0 - 16.0 g/dL 11.0(L) 12.1 13.1  Hematocrit 35.0 - 47.0  % 35.6 38.5 42.0  Platelets 150 - 440 K/uL 190 214 231   CMP Latest Ref Rng & Units 10/27/2016 10/26/2016 10/01/2016  Glucose 65 - 99 mg/dL 94 104(H) 96  BUN 6 - 20 mg/dL 8 10 11   Creatinine 0.44 - 1.00 mg/dL 0.69 0.66 0.69  Sodium 135 - 145 mmol/L 142 142 139  Potassium 3.5 - 5.1 mmol/L 3.9 3.9 4.1  Chloride 101 - 111 mmol/L 110 108 105  CO2 22 - 32 mmol/L 28 23 25   Calcium 8.9 - 10.3 mg/dL 8.5(L) 9.3 9.0  Total Protein 6.5 - 8.1 g/dL - 7.5 7.4  Total Bilirubin 0.3 - 1.2 mg/dL - 0.7 0.5  Alkaline Phos 38 - 126 U/L - 94 78  AST 15 - 41 U/L - 14(L) 16  ALT 14 - 54 U/L - 11(L) 10(L)   Imaging studies: No new pertinent imaging studies  Assessment/Plan: (ICD-10's: K57.32) 56 y.o.femalewith improving incompletely treated sigmoid colonic diverticulitisconsidering~2 cm intramural phlegmon, complicated by pertinent comorbidities including obesity (BMI 45), former tobacco abuse, and alack of routine and preventative medical care due to chronic uninsured status.  - continue soft mechanical/low residue diet while recovering - Augmentin x 2 weeks for persistent/recurrent acute diverticulitis with phlegmon  - outpatient surgical follow-up in 1-2 weeks before completing course of PO antibiotics  - will need outpatient colonoscopy upon resolution of inflammatory process (also due for screening colonoscopy) - no indication for urgent surgical intervention at this time  - weight loss again encouraged and discussed  - agree with discharge home per primary team  All of the above findings and recommendations were discussed with the patient, and all of patient's questions were  answered to her expressed satisfaction.  Thank you for the opportunity to participate in this patient's care.   -- Marilynne Drivers Rosana Hoes, MD, [facility] - Partnering for exceptional care. Office: [phone]

## 2016-10-29 NOTE — Progress Notes (Signed)
IV was removed. Discharge instructions, follow-up appointments, and prescriptions were provided to the pt. All questions were answered.

## 2016-10-29 NOTE — Care Management (Signed)
Patient to discharge home today.  Self pay.  Patient states that she is employed.  Patient does not have PCP.  Provided application to [facility] and medication management.  Patient was provided coupons for pain medication and antibiotics from [email or web address].  Patient denies issues obtaining medication.  RNCM signing off

## 2016-10-29 NOTE — Discharge Summary (Signed)
[facility] [place] NAME: Taylor Ray    MR#:  [id number]  DATE OF BIRTH:  [birth year]  DATE OF ADMISSION:  10/26/2016 ADMITTING PHYSICIAN: Theodoro Grist, MD  DATE OF DISCHARGE: No discharge date for patient encounter.  PRIMARY CARE PHYSICIAN: No PCP Per Patient     ADMISSION DIAGNOSIS:  Diverticulitis [K57.92]  DISCHARGE DIAGNOSIS:  Active Problems:   Diverticulitis of colon   Hyperglycemia   Sigmoid diverticulitis   Numbness and tingling of left lower extremity   Lumbar radiculopathy   Spinal stenosis, lumbar   Tobacco abuse counseling   SECONDARY DIAGNOSIS:   Past Medical History:  Diagnosis Date  . Diverticulitis     .pro HOSPITAL COURSE:  The patient is a 56 year old female with past medical history significant for history of endometriosis, chronic pelvic pain, who presented to the hospital with complaints of left-sided abdominal pain. Apparently patient was diagnosed with diverticulitis, initiated on Cipro and Flagyl, however, did not improve significantly and presented to hospital with worsening pain. CT scan revealed possible phlegmon and the patient was admitted today she was initiated on Zosyn and clinically improved. She was seen by surgeon while in the hospital who recommended to continue antibiotic therapy for at least 14 days and follow-up with surgery as well as gastroenterology as outpatient. Due to left lower extremity numbness, patient underwent MRI of her lumbar spine, revealing spinal stenosis, spondylopathy, patient was seen by neurosurgeon, who recommended to follow-up with him in the office. Patient was felt to be stable to be discharged home today Discussion by problem: #1. Sigmoid colon diverticulitis with phlegmon, patient received Zosyn while in the hospital, improved clinically, was seen by surgeon, who recommended to continue antibiotic therapy and follow-up with surgery and  gastroenterology as outpatient. Patient is being discharged on Augmentin 14 day therapy. #2. Lumbar radiculopathy and spinal stenosis and chronic left lower extremity numbness, power was intact, physical therapist evaluated the patient and recommended outpatient physical therapy. Patient was seen by neurosurgeon and neurologist, recommended neurosurgical follow-up as outpatient. MRI of lumbar spine revealed spondylosis, L4-L5, moderate to severe central canal narrowing, disc bulge #3. Tobacco abuse counseling. Patient is to continue nicotine patch as outpatient #4. Obesity, LDL was found to be 142, triglycerides 97, TSH 0.7, hemoglobin A1c 5.1, weight loss was recommended #5. Hyperglycemia, and 11 A1c 5.1, no diabetes #6. Elevated blood pressure without diagnosis of hypertension, patient's blood pressure was elevated while the hospital intermittently, could be related to stress, pain, it is recommended to follow blood pressure readings as outpatient, home settings, and make decisions about initiation of antihypertensives if needed  DISCHARGE CONDITIONS:   Stable  CONSULTS OBTAINED:  Treatment Team:  Vickie Epley, MD Alexis Goodell, MD Bayard Hugger, MD  DRUG ALLERGIES:   Allergies  Allergen Reactions  . Penicillins Other (See Comments)    sts allergy from childhood  . Benadryl [Diphenhydramine Hcl] Hives and Anxiety    DISCHARGE MEDICATIONS:   Current Discharge Medication List    START taking these medications   Details  amoxicillin-clavulanate (AUGMENTIN) 875-125 MG tablet Take 1 tablet by mouth 2 (two) times daily. Qty: 28 tablet, Refills: 0    feeding supplement (BOOST / RESOURCE BREEZE) LIQD Take 1 Container by mouth 3 (three) times daily between meals. Qty: 90 Container, Refills: 0    hydrOXYzine (ATARAX/VISTARIL) 25 MG tablet Take 1 tablet (25 mg total) by mouth every 6 (six) hours as needed for itching.  Qty: 30 tablet, Refills: 0    nicotine (NICODERM CQ - DOSED IN  MG/24 HOURS) 21 mg/24hr patch Place 1 patch (21 mg total) onto the skin daily. Qty: 28 patch, Refills: 0    polyethylene glycol (MIRALAX / GLYCOLAX) packet Take 17 g by mouth daily. Qty: 14 each, Refills: 0    simethicone (MYLICON) 80 MG chewable tablet Chew 1 tablet (80 mg total) by mouth 4 (four) times daily. Qty: 60 tablet, Refills: 0      CONTINUE these medications which have CHANGED   Details  oxyCODONE-acetaminophen (PERCOCET) 7.5-325 MG tablet Take 1 tablet by mouth every 4 (four) hours as needed for moderate pain. Qty: 120 tablet, Refills: 0      STOP taking these medications     naproxen sodium (ANAPROX) 220 MG tablet      ciprofloxacin (CIPRO) 500 MG tablet      metroNIDAZOLE (FLAGYL) 500 MG tablet      predniSONE (STERAPRED UNI-PAK 21 TAB) 10 MG (21) TBPK tablet      traMADol (ULTRAM) 50 MG tablet          DISCHARGE INSTRUCTIONS:    The patient is to follow-up with surgeon, Dr. Rosana Hoes, neurosurgeon, Dr. Cari Caraway, gastroenterologist, Dr. Allen Norris  If you experience worsening of your admission symptoms, develop shortness of breath, life threatening emergency, suicidal or homicidal thoughts you must seek medical attention immediately by calling 911 or calling your MD immediately  if symptoms less severe.  You Must read complete instructions/literature along with all the possible adverse reactions/side effects for all the Medicines you take and that have been prescribed to you. Take any new Medicines after you have completely understood and accept all the possible adverse reactions/side effects.   Please note  You were cared for by a hospitalist during your hospital stay. If you have any questions about your discharge medications or the care you received while you were in the hospital after you are discharged, you can call the unit and asked to speak with the hospitalist on call if the hospitalist that took care of you is not available. Once you are discharged, your  primary care physician will handle any further medical issues. Please note that NO REFILLS for any discharge medications will be authorized once you are discharged, as it is imperative that you return to your primary care physician (or establish a relationship with a primary care physician if you do not have one) for your aftercare needs so that they can reassess your need for medications and monitor your lab values.    Today   CHIEF COMPLAINT:   Chief Complaint  Patient presents with  . Abdominal Pain    HISTORY OF PRESENT ILLNESS:  Taylor Ray  is a 56 y.o. female with a known history of endometriosis, chronic pelvic pain, who presented to the hospital with complaints of left-sided abdominal pain. Apparently patient was diagnosed with diverticulitis, initiated on Cipro and Flagyl, however, did not improve significantly and presented to hospital with worsening pain. CT scan revealed possible phlegmon and the patient was admitted today she was initiated on Zosyn and clinically improved. She was seen by surgeon while in the hospital who recommended to continue antibiotic therapy for at least 14 days and follow-up with surgery as well as gastroenterology as outpatient. Due to left lower extremity numbness, patient underwent MRI of her lumbar spine, revealing spinal stenosis, spondylopathy, patient was seen by neurosurgeon, who recommended to follow-up with him in the office. Patient was felt to  be stable to be discharged home today Discussion by problem: #1. Sigmoid colon diverticulitis with phlegmon, patient received Zosyn while in the hospital, improved clinically, was seen by surgeon, who recommended to continue antibiotic therapy and follow-up with surgery and gastroenterology as outpatient. Patient is being discharged on Augmentin 14 day therapy. #2. Lumbar radiculopathy and spinal stenosis and chronic left lower extremity numbness, power was intact, physical therapist evaluated the patient  and recommended outpatient physical therapy. Patient was seen by neurosurgeon and neurologist, recommended neurosurgical follow-up as outpatient. MRI of lumbar spine revealed spondylosis, L4-L5, moderate to severe central canal narrowing, disc bulge #3. Tobacco abuse counseling. Patient is to continue nicotine patch as outpatient #4. Obesity, LDL was found to be 142, triglycerides 97, TSH 0.7, hemoglobin A1c 5.1, weight loss was recommended #5. Hyperglycemia, and 11 A1c 5.1, no diabetes #6. Elevated blood pressure without diagnosis of hypertension, patient's blood pressure was elevated while the hospital intermittently, could be related to stress, pain, it is recommended to follow blood pressure readings as outpatient, home settings, and make decisions about initiation of antihypertensives if needed    VITAL SIGNS:  Blood pressure (!) 152/71, pulse 70, temperature 97.6 F (36.4 C), resp. rate 20, height 5\' 4"  (1.626 m), weight 116.9 kg (257 lb 11.2 oz), SpO2 99 %.  I/O:   Intake/Output Summary (Last 24 hours) at 10/29/16 1313 Last data filed at 10/29/16 0742  Gross per 24 hour  Intake          3754.33 ml  Output             1650 ml  Net          2104.33 ml    PHYSICAL EXAMINATION:  GENERAL:  56 y.o.-year-old patient lying in the bed with no acute distress.  EYES: Pupils equal, round, reactive to light and accommodation. No scleral icterus. Extraocular muscles intact.  HEENT: Head atraumatic, normocephalic. Oropharynx and nasopharynx clear.  NECK:  Supple, no jugular venous distention. No thyroid enlargement, no tenderness.  LUNGS: Normal breath sounds bilaterally, no wheezing, rales,rhonchi or crepitation. No use of accessory muscles of respiration.  CARDIOVASCULAR: S1, S2 normal. No murmurs, rubs, or gallops.  ABDOMEN: Soft, non-tender, non-distended. Bowel sounds present. No organomegaly or mass.  EXTREMITIES: No pedal edema, cyanosis, or clubbing.  NEUROLOGIC: Cranial nerves II  through XII are intact. Muscle strength 5/5 in all extremities. Sensation intact. Gait not checked.  PSYCHIATRIC: The patient is alert and oriented x 3.  SKIN: No obvious rash, lesion, or ulcer.   DATA REVIEW:   CBC  Recent Labs Lab 10/27/16 0529  WBC 7.0  HGB 11.0*  HCT 35.6  PLT 190    Chemistries   Recent Labs Lab 10/26/16 0903 10/27/16 0529  NA 142 142  K 3.9 3.9  CL 108 110  CO2 23 28  GLUCOSE 104* 94  BUN 10 8  CREATININE 0.66 0.69  CALCIUM 9.3 8.5*  AST 14*  --   ALT 11*  --   ALKPHOS 94  --   BILITOT 0.7  --     Cardiac Enzymes No results for input(s): TROPONINI in the last 168 hours.  Microbiology Results  No results found for this or any previous visit.  RADIOLOGY:  No results found.  EKG:   Orders placed or performed during the hospital encounter of 09/25/16  . EKG      Management plans discussed with the patient, family and they are in agreement.  CODE STATUS:  Code Status Orders        Start     Ordered   10/26/16 1428  Full code  Continuous     10/26/16 1427    Code Status History    Date Active Date Inactive Code Status Order ID Comments User Context   This patient has a current code status but no historical code status.      TOTAL TIME TAKING CARE OF THIS PATIENT: 40 minutes.    Theodoro Grist M.D on 10/29/2016 at 1:13 PM  Between 7am to 6pm - Pager - [phone]  After 6pm go to [email or web address] - password [email or web address]  [facility]  Office  [phone]  CC: Primary care physician; No PCP Per Patient

## 2016-11-10 ENCOUNTER — Ambulatory Visit (INDEPENDENT_AMBULATORY_CARE_PROVIDER_SITE_OTHER): Payer: Self-pay | Admitting: Surgery

## 2016-11-10 ENCOUNTER — Encounter: Payer: Self-pay | Admitting: Surgery

## 2016-11-10 VITALS — BP 139/91 | HR 67 | Temp 98.7°F | Ht 64.0 in | Wt 253.0 lb

## 2016-11-10 DIAGNOSIS — K5732 Diverticulitis of large intestine without perforation or abscess without bleeding: Secondary | ICD-10-CM

## 2016-11-10 NOTE — Progress Notes (Signed)
Outpatient Surgical Follow Up  11/10/2016  Taylor Ray is an 56 y.o. female.   Chief Complaint  Patient presents with  . New Patient (Initial Visit)    Sigmoid Diverticulitis    HPI: Taylor Ray is 77 morbidly obese female w a hx of recurrent complicated diverticulitis w a small Abscess treated with IV antibiotics and discharged approximately 10 days ago. She now feels better, initially a week ago had some pain and emesis but that has subsided. Now she is able to tolerate liquids and but has decreased appetite. She is afraid of having big meals. She is now having loose bowel movements since antibiotics were started. No fevers no chills no abdominal pain. She does smoke about a third pack a day and drapes she is able to perform more than 4 Mets of activity without any shortness of breath or chest pain. Of note she did have a similar episode requiring antibiotic and hospital admission more than 9 years ago. Her last colonoscopy was 18 years ago which was normal. Most recent CT scan I have personally reviewed there is evidence of diverticulitis with a small collection. There was no free air.  Past Medical History:  Diagnosis Date  . Diverticulitis   . Diverticulitis of colon 10/26/2016  . Hyperglycemia 10/26/2016  . Lumbar radiculopathy 10/29/2016  . Numbness and tingling of left lower extremity   . Sigmoid diverticulitis 10/26/2016  . Spinal stenosis, lumbar 10/29/2016  . Tobacco abuse counseling 10/29/2016    Past Surgical History:  Procedure Laterality Date  . ABLATION ON ENDOMETRIOSIS N/A 2003  . TONSILLECTOMY  1973    Family History  Problem Relation Age of Onset  . Hypertension Mother   . Uterine cancer Mother   . Gout Father   . Hypertension Maternal Grandmother     Social History:  reports that she has been smoking Cigarettes and E-cigarettes.  She has been smoking about 0.50 packs per day. She has never used smokeless tobacco. She reports that she does not drink alcohol or  use drugs.  Allergies:  Allergies  Allergen Reactions  . Benadryl [Diphenhydramine Hcl] Hives and Anxiety  . Penicillins Other (See Comments)    sts allergy from childhood- unknown reaction    Medications reviewed.    ROS Full ROS performed and is otherwise negative other than what is stated in HPI   BP (!) 139/91   Pulse 67   Temp 98.7 F (37.1 C) (Oral)   Ht 5\' 4"  (1.626 m)   Wt 114.8 kg (253 lb)   BMI 43.43 kg/m   Physical Exam  Constitutional: She is oriented to person, place, and time and well-developed, well-nourished, and in no distress. No distress.  Eyes: Conjunctivae are normal. Right eye exhibits no discharge. Left eye exhibits no discharge. No scleral icterus.  Neck: Normal range of motion. Neck supple. No JVD present.  Pulmonary/Chest: Effort normal. No stridor. No respiratory distress.  Abdominal: Soft. She exhibits no distension and no mass. There is no tenderness. There is no rebound and no guarding.  Musculoskeletal: Normal range of motion. She exhibits no edema or deformity.  Neurological: She is alert and oriented to person, place, and time. No cranial nerve deficit. Gait normal. GCS score is 15.  Skin: Skin is warm and dry.  Psychiatric: Mood, memory, affect and judgment normal.  Nursing note and vitals reviewed.      No results found for this or any previous visit (from the past 48 hour(s)). No results found.  Assessment/Plan:  1. Diverticulitis of colon Currently improving diverticulitis with medical treatment. Discussed with patient in detail length about my recommendation for elective sigmoid colectomy given the fact that she has had 2 episodes of recurrent complicated diverticulitis. Prior to this she will need a full colonoscopy and needs quit smoking and control her weight. Discussed with the patient in detail about preoperative optimization before major elective surgery schedule. She understands and is in agreement. We'll coordinate for  her colonoscopy in about a month or so. RTC 2 months. No need for immediate surgical intervention at this time.   Caroleen Hamman, MD [facility] General Surgeon

## 2016-11-10 NOTE — Patient Instructions (Signed)
We will refer you to a Gastroenterologist ( GI) to have a Colonoscopy in approximately 1 month. Their office will call you for an appointment.  I would like for you to work on quitting smoking and losing some weight. Please see the information below for help.  We will see you back in 2 months to see how you are doing and further discuss surgery in more detail. Please see your appointment below.  If at any time, you begin to feel as you as having another episode of Diverticulitis, Call our office immediately so that we may send in antibiotics and see you in clinic right away.  Call our office with any questions or concerns that you may have prior to your next appointment.    Diverticulitis Diverticulitis is inflammation or infection of small pouches in your colon that form when you have a condition called diverticulosis. The pouches in your colon are called diverticula. Your colon, or large intestine, is where water is absorbed and stool is formed. Complications of diverticulitis can include:  Bleeding.  Severe infection.  Severe pain.  Perforation of your colon.  Obstruction of your colon. What are the causes? Diverticulitis is caused by bacteria. Diverticulitis happens when stool becomes trapped in diverticula. This allows bacteria to grow in the diverticula, which can lead to inflammation and infection. What increases the risk? People with diverticulosis are at risk for diverticulitis. Eating a diet that does not include enough fiber from fruits and vegetables may make diverticulitis more likely to develop. What are the signs or symptoms? Symptoms of diverticulitis may include:  Abdominal pain and tenderness. The pain is normally located on the left side of the abdomen, but may occur in other areas.  Fever and chills.  Bloating.  Cramping.  Nausea.  Vomiting.  Constipation.  Diarrhea.  Blood in your stool. How is this diagnosed? Your health care provider  will ask you about your medical history and do a physical exam. You may need to have tests done because many medical conditions can cause the same symptoms as diverticulitis. Tests may include:  Blood tests.  Urine tests.  Imaging tests of the abdomen, including X-rays and CT scans. When your condition is under control, your health care provider may recommend that you have a colonoscopy. A colonoscopy can show how severe your diverticula are and whether something else is causing your symptoms. How is this treated? Most cases of diverticulitis are mild and can be treated at home. Treatment may include:  Taking over-the-counter pain medicines.  Following a clear liquid diet.  Taking antibiotic medicines by mouth for 7-10 days. More severe cases may be treated at a hospital. Treatment may include:  Not eating or drinking.  Taking prescription pain medicine.  Receiving antibiotic medicines through an IV tube.  Receiving fluids and nutrition through an IV tube.  Surgery. Follow these instructions at home:  Follow your health care provider's instructions carefully.  Follow a full liquid diet or other diet as directed by your health care provider. After your symptoms improve, your health care provider may tell you to change your diet. He or she may recommend you eat a high-fiber diet. Fruits and vegetables are good sources of fiber. Fiber makes it easier to pass stool.  Take fiber supplements or probiotics as directed by your health care provider.  Only take medicines as directed by your health care provider.  Keep all your follow-up appointments. Contact a health care provider if:  Your pain does not improve.  You have a hard time eating food.  Your bowel movements do not return to normal. Get help right away if:  Your pain becomes worse.  Your symptoms do not get better.  Your symptoms suddenly get worse.  You have a fever.  You have repeated vomiting.  You have  bloody or black, tarry stools. This information is not intended to replace advice given to you by your health care provider. Make sure you discuss any questions you have with your health care provider. Document Released: 03/25/2005 Document Revised: 11/21/2015 Document Reviewed: 05/10/2013 Elsevier Interactive Patient Education  2017 [place].    High-Fiber Diet Fiber, also called dietary fiber, is a type of carbohydrate found in fruits, vegetables, whole grains, and beans. A high-fiber diet can have many health benefits. Your health care provider may recommend a high-fiber diet to help:  Prevent constipation. Fiber can make your bowel movements more regular.  Lower your cholesterol.  Relieve hemorrhoids, uncomplicated diverticulosis, or irritable bowel syndrome.  Prevent overeating as part of a weight-loss plan.  Prevent heart disease, type 2 diabetes, and certain cancers. What is my plan? The recommended daily intake of fiber includes:  38 grams for men under age 71.  19 grams for men over age 68.  57 grams for women under age 54.  60 grams for women over age 17. You can get the recommended daily intake of dietary fiber by eating a variety of fruits, vegetables, grains, and beans. Your health care provider may also recommend a fiber supplement if it is not possible to get enough fiber through your diet. What do I need to know about a high-fiber diet?  Fiber supplements have not been widely studied for their effectiveness, so it is better to get fiber through food sources.  Always check the fiber content on thenutrition facts label of any prepackaged food. Look for foods that contain at least 5 grams of fiber per serving.  Ask your dietitian if you have questions about specific foods that are related to your condition, especially if those foods are not listed in the following section.  Increase your daily fiber consumption gradually. Increasing your intake of dietary  fiber too quickly may cause bloating, cramping, or gas.  Drink plenty of water. Water helps you to digest fiber. What foods can I eat? Grains  Whole-grain breads. Multigrain cereal. Oats and oatmeal. Brown rice. Barley. Bulgur wheat. [place]. Bran muffins. Popcorn. Rye wafer crackers. Vegetables  Sweet potatoes. Spinach. Kale. Artichokes. Cabbage. Broccoli. Green peas. Carrots. Squash. Fruits  Berries. Pears. Apples. Oranges. Avocados. Prunes and raisins. Dried figs. Meats and Other Protein Sources  [place], kidney, pinto, and soy beans. Split peas. Lentils. Nuts and seeds. Dairy  Fiber-fortified yogurt. Beverages  Fiber-fortified soy milk. Fiber-fortified orange juice. Other  Fiber bars. The items listed above may not be a complete list of recommended foods or beverages. Contact your dietitian for more options.  What foods are not recommended? Grains  White bread. Pasta made with refined flour. White rice. Vegetables  Fried potatoes. Canned vegetables. Well-cooked vegetables. Fruits  Fruit juice. Cooked, strained fruit. Meats and Other Protein Sources  Fatty cuts of meat. Fried Sales executive or fried fish. Dairy  Milk. Yogurt. Cream cheese. Sour cream. Beverages  Soft drinks. Other  Cakes and pastries. Butter and oils. The items listed above may not be a complete list of foods and beverages to avoid. Contact your dietitian for more information.  What are some tips for including high-fiber foods in my diet?  Eat a wide variety of high-fiber foods.  Make sure that half of all grains consumed each day are whole grains.  Replace breads and cereals made from refined flour or white flour with whole-grain breads and cereals.  Replace white rice with brown rice, bulgur wheat, or millet.  Start the day with a breakfast that is high in fiber, such as a cereal that contains at least 5 grams of fiber per serving.  Use beans in place of meat in soups, salads, or pasta.  Eat high-fiber  snacks, such as berries, raw vegetables, nuts, or popcorn. This information is not intended to replace advice given to you by your health care provider. Make sure you discuss any questions you have with your health care provider. Document Released: 06/15/2005 Document Revised: 11/21/2015 Document Reviewed: 11/28/2013 Elsevier Interactive Patient Education  2017 [place] with Quitting Smoking Quitting smoking is a physical and mental challenge. You will face cravings, withdrawal symptoms, and temptation. Before quitting, work with your health care provider to make a plan that can help you cope. Preparation can help you quit and keep you from giving in. How can I cope with cravings? Cravings usually last for 5-10 minutes. If you get through it, the craving will pass. Consider taking the following actions to help you cope with cravings:  Keep your mouth busy:  Chew sugar-free gum.  Suck on hard candies or a straw.  Brush your teeth.  Keep your hands and body busy:  Immediately change to a different activity when you feel a craving.  Squeeze or play with a ball.  Do an activity or a hobby, like making bead jewelry, practicing needlepoint, or working with wood.  Mix up your normal routine.  Take a short exercise break. Go for a quick walk or run up and down stairs.  Spend time in public places where smoking is not allowed.  Focus on doing something kind or helpful for someone else.  Call a friend or family member to talk during a craving.  Join a support group.  Call a quit line, such as 1-800-QUIT-NOW.  Talk with your health care provider about medicines that might help you cope with cravings and make quitting easier for you. How can I deal with withdrawal symptoms? Your body may experience negative effects as it tries to get used to not having nicotine in the system. These effects are called withdrawal symptoms. They may include:  Feeling hungrier than  normal.  Trouble concentrating.  Irritability.  Trouble sleeping.  Feeling depressed.  Restlessness and agitation.  Craving a cigarette. 1.  To manage withdrawal symptoms:  Avoid places, people, and activities that trigger your cravings.  Remember why you want to quit.  Get plenty of sleep.  Avoid coffee and other caffeinated drinks. These may worsen some of your symptoms. How can I handle social situations? Social situations can be difficult when you are quitting smoking, especially in the first few weeks. To manage this, you can:  Avoid parties, bars, and other social situations where people might be smoking.  Avoid alcohol.  Leave right away if you have the urge to smoke.  Explain to your family and friends that you are quitting smoking. Ask for understanding and support.  Plan activities with friends or family where smoking is not an option. What are some ways I can cope with stress? Wanting to smoke may cause stress, and stress can make you want to smoke. Find ways to manage your stress.  Relaxation techniques can help. For example:  Breathe slowly and deeply, in through your nose and out through your mouth.  Listen to soothing, relaxing music.  Talk with a family member or friend about your stress.  Light a candle.  Soak in a bath or take a shower.  Think about a peaceful place. What are some ways I can prevent weight gain? Be aware that many people gain weight after they quit smoking. However, not everyone does. To keep from gaining weight, have a plan in place before you quit and stick to the plan after you quit. Your plan should include:  Having healthy snacks. When you have a craving, it may help to:  Eat plain popcorn, crunchy carrots, celery, or other cut vegetables.  Chew sugar-free gum.  Changing how you eat:  Eat small portion sizes at meals.  Eat 4-6 small meals throughout the day instead of 1-2 large meals a day.  Be mindful when you  eat. Do not watch television or do other things that might distract you as you eat.  Exercising regularly:  Make time to exercise each day. If you do not have time for a long workout, do short bouts of exercise for 5-10 minutes several times a day.  Do some form of strengthening exercise, like weight lifting, and some form of aerobic exercise, like running or swimming.  Drinking plenty of water or other low-calorie or no-calorie drinks. Drink 6-8 glasses of water daily, or as much as instructed by your health care provider. Summary  Quitting smoking is a physical and mental challenge. You will face cravings, withdrawal symptoms, and temptation to smoke again. Preparation can help you as you go through these challenges.  You can cope with cravings by keeping your mouth busy (such as by chewing gum), keeping your body and hands busy, and making calls to family, friends, or a helpline for people who want to quit smoking.  You can cope with withdrawal symptoms by avoiding places where people smoke, avoiding drinks with caffeine, and getting plenty of rest.  Ask your health care provider about the different ways to prevent weight gain, avoid stress, and handle social situations. This information is not intended to replace advice given to you by your health care provider. Make sure you discuss any questions you have with your health care provider. Document Released: 06/12/2016 Document Revised: 06/12/2016 Document Reviewed: 06/12/2016 Elsevier Interactive Patient Education  2017 [employer].    Exercising to [employer] Exercising can help you to lose weight. In order to lose weight through exercise, you need to do vigorous-intensity exercise. You can tell that you are exercising with vigorous intensity if you are breathing very hard and fast and cannot hold a conversation while exercising. Moderate-intensity exercise helps to maintain your current weight. You can tell that you are exercising  at a moderate level if you have a higher heart rate and faster breathing, but you are still able to hold a conversation. How often should I exercise? Choose an activity that you enjoy and set realistic goals. Your health care provider can help you to make an activity plan that works for you. Exercise regularly as directed by your health care provider. This may include:  Doing resistance training twice each week, such as:  Push-ups.  Sit-ups.  Lifting weights.  Using resistance bands.  Doing a given intensity of exercise for a given amount of time. Choose from these options:  150 minutes of moderate-intensity exercise every week.  75 minutes  of vigorous-intensity exercise every week.  A mix of moderate-intensity and vigorous-intensity exercise every week. Children, pregnant women, people who are out of shape, people who are overweight, and older adults may need to consult a health care provider for individual recommendations. If you have any sort of medical condition, be sure to consult your health care provider before starting a new exercise program. What are some activities that can help me to lose weight?  Walking at a rate of at least 4.5 miles an hour.  Jogging or running at a rate of 5 miles per hour.  Biking at a rate of at least 10 miles per hour.  Lap swimming.  Roller-skating or in-line skating.  Cross-country skiing.  Vigorous competitive sports, such as football, basketball, and soccer.  Jumping rope.  Aerobic dancing. How can I be more active in my day-to-day activities?  Use the stairs instead of the elevator.  Take a walk during your lunch break.  If you drive, park your car farther away from work or school.  If you take public transportation, get off one stop early and walk the rest of the way.  Make all of your phone calls while standing up and walking around.  Get up, stretch, and walk around every 30 minutes throughout the day. What guidelines  should I follow while exercising?  Do not exercise so much that you hurt yourself, feel dizzy, or get very short of breath.  Consult your health care provider prior to starting a new exercise program.  Wear comfortable clothes and shoes with good support.  Drink plenty of water while you exercise to prevent dehydration or heat stroke. Body water is lost during exercise and must be replaced.  Work out until you breathe faster and your heart beats faster. This information is not intended to replace advice given to you by your health care provider. Make sure you discuss any questions you have with your health care provider. Document Released: 07/18/2010 Document Revised: 11/21/2015 Document Reviewed: 11/16/2013 Elsevier Interactive Patient Education  2017 [employer].

## 2016-11-27 ENCOUNTER — Other Ambulatory Visit: Payer: Self-pay

## 2016-11-27 DIAGNOSIS — K5732 Diverticulitis of large intestine without perforation or abscess without bleeding: Secondary | ICD-10-CM

## 2016-12-09 ENCOUNTER — Encounter: Payer: Self-pay | Admitting: *Deleted

## 2016-12-15 ENCOUNTER — Telehealth: Payer: Self-pay

## 2016-12-15 NOTE — Telephone Encounter (Signed)
Courtney from [facility] called stated Suprep is too expensive.  Rx has been changed to [employer].

## 2016-12-16 NOTE — Discharge Instructions (Signed)
General Anesthesia, Adult, Care After °These instructions provide you with information about caring for yourself after your procedure. Your health care provider may also give you more specific instructions. Your treatment has been planned according to current medical practices, but problems sometimes occur. Call your health care provider if you have any problems or questions after your procedure. °What can I expect after the procedure? °After the procedure, it is common to have: °· Vomiting. °· A sore throat. °· Mental slowness. ° °It is common to feel: °· Nauseous. °· Cold or shivery. °· Sleepy. °· Tired. °· Sore or achy, even in parts of your body where you did not have surgery. ° °Follow these instructions at home: °For at least 24 hours after the procedure: °· Do not: °? Participate in activities where you could fall or become injured. °? Drive. °? Use heavy machinery. °? Drink alcohol. °? Take sleeping pills or medicines that cause drowsiness. °? Make important decisions or sign legal documents. °? Take care of children on your own. °· Rest. °Eating and drinking °· If you vomit, drink water, juice, or soup when you can drink without vomiting. °· Drink enough fluid to keep your urine clear or pale yellow. °· Make sure you have little or no nausea before eating solid foods. °· Follow the diet recommended by your health care provider. °General instructions °· Have a responsible adult stay with you until you are awake and alert. °· Return to your normal activities as told by your health care provider. Ask your health care provider what activities are safe for you. °· Take over-the-counter and prescription medicines only as told by your health care provider. °· If you smoke, do not smoke without supervision. °· Keep all follow-up visits as told by your health care provider. This is important. °Contact a health care provider if: °· You continue to have nausea or vomiting at home, and medicines are not helpful. °· You  cannot drink fluids or start eating again. °· You cannot urinate after 8-12 hours. °· You develop a skin rash. °· You have fever. °· You have increasing redness at the site of your procedure. °Get help right away if: °· You have difficulty breathing. °· You have chest pain. °· You have unexpected bleeding. °· You feel that you are having a life-threatening or urgent problem. °This information is not intended to replace advice given to you by your health care provider. Make sure you discuss any questions you have with your health care provider. °Document Released: 09/21/2000 Document Revised: 11/18/2015 Document Reviewed: 05/30/2015 °Elsevier Interactive Patient Education © 2018 Elsevier Inc. ° °

## 2016-12-17 ENCOUNTER — Ambulatory Visit: Payer: Self-pay | Admitting: Anesthesiology

## 2016-12-17 ENCOUNTER — Encounter
Admission: RE | Disposition: A | Discharge status: Home / Self Care | Payer: Self-pay | Source: Ambulatory Visit | Attending: Gastroenterology

## 2016-12-17 ENCOUNTER — Ambulatory Visit
Admission: RE | Admit: 2016-12-17 | Discharge: 2016-12-17 | Disposition: A | Discharge status: Home / Self Care | Payer: Self-pay | Source: Ambulatory Visit | Attending: Gastroenterology | Admitting: Gastroenterology

## 2016-12-17 DIAGNOSIS — M48061 Spinal stenosis, lumbar region without neurogenic claudication: Secondary | ICD-10-CM | POA: Insufficient documentation

## 2016-12-17 DIAGNOSIS — K641 Second degree hemorrhoids: Secondary | ICD-10-CM | POA: Insufficient documentation

## 2016-12-17 DIAGNOSIS — K5732 Diverticulitis of large intestine without perforation or abscess without bleeding: Secondary | ICD-10-CM

## 2016-12-17 DIAGNOSIS — D125 Benign neoplasm of sigmoid colon: Secondary | ICD-10-CM | POA: Insufficient documentation

## 2016-12-17 DIAGNOSIS — Z79899 Other long term (current) drug therapy: Secondary | ICD-10-CM | POA: Insufficient documentation

## 2016-12-17 DIAGNOSIS — K635 Polyp of colon: Secondary | ICD-10-CM

## 2016-12-17 DIAGNOSIS — K5730 Diverticulosis of large intestine without perforation or abscess without bleeding: Secondary | ICD-10-CM | POA: Insufficient documentation

## 2016-12-17 DIAGNOSIS — F17210 Nicotine dependence, cigarettes, uncomplicated: Secondary | ICD-10-CM | POA: Insufficient documentation

## 2016-12-17 HISTORY — DX: Presence of dental prosthetic device (complete) (partial): Z97.2

## 2016-12-17 HISTORY — PX: BIOPSY: SHX5522

## 2016-12-17 HISTORY — PX: COLONOSCOPY WITH PROPOFOL: SHX5780

## 2016-12-17 HISTORY — DX: Unspecified osteoarthritis, unspecified site: M19.90

## 2016-12-17 SURGERY — COLONOSCOPY WITH PROPOFOL
Anesthesia: General | Site: Rectum | Wound class: Dirty or Infected

## 2016-12-17 MED ORDER — PROPOFOL 10 MG/ML IV BOLUS
INTRAVENOUS | Status: DC | PRN
  Administered 2016-12-17 (×3): 20 mg via INTRAVENOUS
  Administered 2016-12-17: 70 mg via INTRAVENOUS
  Administered 2016-12-17: 20 mg via INTRAVENOUS

## 2016-12-17 MED ORDER — LIDOCAINE HCL (CARDIAC) 20 MG/ML IV SOLN
INTRAVENOUS | Status: DC | PRN
  Administered 2016-12-17: 40 mg via INTRAVENOUS

## 2016-12-17 MED ORDER — LACTATED RINGERS IV SOLN
INTRAVENOUS | Status: DC
  Administered 2016-12-17: 09:00:00 via INTRAVENOUS

## 2016-12-17 MED ORDER — STERILE WATER FOR IRRIGATION IR SOLN
Status: DC | PRN
  Administered 2016-12-17: 10:00:00

## 2016-12-17 SURGICAL SUPPLY — 23 items
CANISTER SUCT 1200ML W/VALVE (MISCELLANEOUS) ×3 IMPLANT
CLIP HMST 235XBRD CATH ROT (MISCELLANEOUS) IMPLANT
CLIP RESOLUTION 360 11X235 (MISCELLANEOUS)
FCP ESCP3.2XJMB 240X2.8X (MISCELLANEOUS)
FORCEPS BIOP RAD 4 LRG CAP 4 (CUTTING FORCEPS) IMPLANT
FORCEPS BIOP RJ4 240 W/NDL (MISCELLANEOUS)
FORCEPS ESCP3.2XJMB 240X2.8X (MISCELLANEOUS) IMPLANT
GOWN CVR UNV OPN BCK APRN NK (MISCELLANEOUS) ×2 IMPLANT
GOWN ISOL THUMB LOOP REG UNIV (MISCELLANEOUS) ×6
INJECTOR VARIJECT VIN23 (MISCELLANEOUS) IMPLANT
KIT DEFENDO VALVE AND CONN (KITS) IMPLANT
KIT ENDO PROCEDURE OLY (KITS) ×3 IMPLANT
MARKER SPOT ENDO TATTOO 5ML (MISCELLANEOUS) IMPLANT
PAD GROUND ADULT SPLIT (MISCELLANEOUS) IMPLANT
PROBE APC STR FIRE (PROBE) IMPLANT
RETRIEVER NET ROTH 2.5X230 LF (MISCELLANEOUS) IMPLANT
SNARE SHORT THROW 13M SML OVAL (MISCELLANEOUS) ×2 IMPLANT
SNARE SHORT THROW 30M LRG OVAL (MISCELLANEOUS) IMPLANT
SNARE SNG USE RND 15MM (INSTRUMENTS) IMPLANT
SPOT EX ENDOSCOPIC TATTOO (MISCELLANEOUS)
TRAP ETRAP POLY (MISCELLANEOUS) ×2 IMPLANT
VARIJECT INJECTOR VIN23 (MISCELLANEOUS)
WATER STERILE IRR 250ML POUR (IV SOLUTION) ×3 IMPLANT

## 2016-12-17 NOTE — H&P (Signed)
Taylor Lame, MD [address] [place], [place] [address] Phone:[phone] Fax : [phone]  Primary Care Physician:  Patient, No Pcp Per Primary Gastroenterologist:  Dr. Allen Norris  Pre-Procedure History & Physical: HPI:  Taylor Ray is a 56 y.o. female is here for an colonoscopy.   Past Medical History:  Diagnosis Date  . Arthritis    legs, hips  . Diverticulitis   . Diverticulitis of colon 10/26/2016  . Hyperglycemia 10/26/2016  . Lumbar radiculopathy 10/29/2016  . Numbness and tingling of left lower extremity   . Sigmoid diverticulitis 10/26/2016  . Spinal stenosis, lumbar 10/29/2016  . Tobacco abuse counseling 10/29/2016  . Wears dentures    partial lower    Past Surgical History:  Procedure Laterality Date  . ABLATION ON ENDOMETRIOSIS N/A 2003  . TONSILLECTOMY  1973    Prior to Admission medications   Medication Sig Start Date End Date Taking? Authorizing Provider  naproxen sodium (ANAPROX) 220 MG tablet Take 220 mg by mouth 3 (three) times daily with meals.   Yes [provider]  oxyCODONE-acetaminophen (PERCOCET) 7.5-325 MG tablet Take 1 tablet by mouth every 4 (four) hours as needed for moderate pain. 10/29/16  Yes Theodoro Grist, MD  varenicline (CHANTIX PAK) 0.5 MG X 11 & 1 MG X 42 tablet Take by mouth 2 (two) times daily. Take one 0.5 mg tablet by mouth once daily for 3 days, then increase to one 0.5 mg tablet twice daily for 4 days, then increase to one 1 mg tablet twice daily.   Yes [provider]  Wheat Dextrin (BENEFIBER DRINK MIX PO) Take 1 Dose by mouth daily.   Yes [provider]  hydrOXYzine (ATARAX/VISTARIL) 25 MG tablet Take 1 tablet (25 mg total) by mouth every 6 (six) hours as needed for itching. 10/29/16   Theodoro Grist, MD    Allergies as of 11/27/2016 - Review Complete 11/10/2016  Allergen Reaction Noted  . Benadryl [diphenhydramine hcl] Hives and Anxiety 09/25/2016  . Penicillins Other (See Comments)  09/25/2016    Family History  Problem Relation Age of Onset  . Hypertension Mother   . Uterine cancer Mother   . Gout Father   . Hypertension Maternal Grandmother     Social History   Social History  . Marital status: Single    Spouse name: N/A  . Number of children: N/A  . Years of education: N/A   Occupational History  . Not on file.   Social History Main Topics  . Smoking status: Current Every Day Smoker    Packs/day: 0.50    Years: 20.00    Types: Cigarettes, E-cigarettes  . Smokeless tobacco: Never Used     Comment: started chantix to try to quit  . Alcohol use No  . Drug use: No  . Sexual activity: No   Other Topics Concern  . Not on file   Social History Narrative  . No narrative on file    Review of Systems: See HPI, otherwise negative ROS  Physical Exam: BP 122/81   Pulse 73   Temp 97.3 F (36.3 C) (Temporal)   Resp 16   Ht 5\' 4"  (1.626 m)   Wt 248 lb (112.5 kg)   SpO2 100%   BMI 42.57 kg/m  General:   Alert,  pleasant and cooperative in NAD Head:  Normocephalic and atraumatic. Neck:  Supple; no masses or thyromegaly. Lungs:  Clear throughout to auscultation.    Heart:  Regular rate and  rhythm. Abdomen:  Soft, nontender and nondistended. Normal bowel sounds, without guarding, and without rebound.   Neurologic:  Alert and  oriented x4;  grossly normal neurologically.  Impression/Plan: Taylor Ray is here for an colonoscopy to be performed for diverticulitis  Risks, benefits, limitations, and alternatives regarding  colonoscopy have been reviewed with the patient.  Questions have been answered.  All parties agreeable.   Taylor Lame, MD  12/17/2016, 9:17 AM

## 2016-12-17 NOTE — Anesthesia Procedure Notes (Signed)
Performed by: Evangela Heffler Pre-anesthesia Checklist: Patient identified, Emergency Drugs available, Suction available, Timeout performed and Patient being monitored Patient Re-evaluated:Patient Re-evaluated prior to induction Oxygen Delivery Method: Nasal cannula Placement Confirmation: positive ETCO2       

## 2016-12-17 NOTE — Anesthesia Postprocedure Evaluation (Signed)
Anesthesia Post Note  Patient: Taylor Ray  Procedure(s) Performed: Procedure(s) (LRB): COLONOSCOPY WITH PROPOFOL (N/A) BIOPSY (N/A)  Patient location during evaluation: PACU Anesthesia Type: General Level of consciousness: awake and alert Pain management: pain level controlled Vital Signs Assessment: post-procedure vital signs reviewed and stable Respiratory status: spontaneous breathing Cardiovascular status: blood pressure returned to baseline Postop Assessment: no headache Anesthetic complications: no    Jaci Standard, III,  Zayonna Ayuso D

## 2016-12-17 NOTE — Op Note (Signed)
[facility] Gastroenterology Patient Name: Taylor Ray Procedure Date: 12/17/2016 9:11 AM MRN: [id number] Account #: [id number] Date of Birth: 06-Mar-1961 Admit Type: Outpatient Age: 56 Room: [facility] OR ROOM 01 Gender: Female Note Status: Finalized Procedure:            Colonoscopy Indications:          Follow-up of diverticulitis Providers:            Lucilla Lame MD, MD Referring MD:         Jules Husbands (Referring MD) Complications:        No immediate complications. Procedure:            Pre-Anesthesia Assessment:                       - Prior to the procedure, a History and Physical was                        performed, and patient medications and allergies were                        reviewed. The patient's tolerance of previous                        anesthesia was also reviewed. The risks and benefits of                        the procedure and the sedation options and risks were                        discussed with the patient. All questions were                        answered, and informed consent was obtained. Prior                        Anticoagulants: The patient has taken no previous                        anticoagulant or antiplatelet agents. ASA Grade                        Assessment: II - A patient with mild systemic disease.                        After reviewing the risks and benefits, the patient was                        deemed in satisfactory condition to undergo the                        procedure.                       After obtaining informed consent, the colonoscope was                        passed under direct vision. Throughout the procedure,                        the patient's blood pressure, pulse, and  oxygen                        saturations were monitored continuously. The Farina (S#: [id number]) was introduced through                        the anus and advanced to the the  cecum, identified by                        appendiceal orifice and ileocecal valve. The                        colonoscopy was performed without difficulty. The                        patient tolerated the procedure well. The quality of                        the bowel preparation was excellent. Findings:      The perianal and digital rectal examinations were normal.      A 4 mm polyp was found in the sigmoid colon. The polyp was pedunculated.       The polyp was removed with a cold snare. Resection and retrieval were       complete.      Multiple small-mouthed diverticula were found in the sigmoid colon.      Non-bleeding internal hemorrhoids were found during retroflexion. The       hemorrhoids were Grade II (internal hemorrhoids that prolapse but reduce       spontaneously). Impression:           - One 4 mm polyp in the sigmoid colon, removed with a                        cold snare. Resected and retrieved.                       - Diverticulosis in the sigmoid colon.                       - Non-bleeding internal hemorrhoids. Recommendation:       - Discharge patient to home.                       - Resume previous diet.                       - Continue present medications.                       - Await pathology results.                       - Repeat colonoscopy in 5 years if polyp adenoma and 10                        years if hyperplastic Procedure Code(s):    --- Professional ---  45385, Colonoscopy, flexible; with removal of tumor(s),                        polyp(s), or other lesion(s) by snare technique Diagnosis Code(s):    --- Professional ---                       A83.41, Diverticulitis of large intestine without                        perforation or abscess without bleeding                       D12.5, Benign neoplasm of sigmoid colon CPT copyright 2016 American Medical Association. All rights reserved. The codes documented in this report are  preliminary and upon coder review may  be revised to meet current compliance requirements. Lucilla Lame MD, MD 12/17/2016 9:48:48 AM This report has been signed electronically. Number of Addenda: 0 Note Initiated On: 12/17/2016 9:11 AM Scope Withdrawal Time: 0 hours 6 minutes 17 seconds  Total Procedure Duration: 0 hours 10 minutes 19 seconds       [facility]

## 2016-12-17 NOTE — Anesthesia Preprocedure Evaluation (Signed)
Anesthesia Evaluation  Patient identified by MRN, date of birth, ID band Patient awake    Reviewed: Allergy & Precautions, H&P , NPO status , Patient's Chart, lab work & pertinent test results  Airway Mallampati: II  TM Distance: >3 FB Neck ROM: full    Dental  (+) Partial Upper   Pulmonary Current Smoker,    Pulmonary exam normal        Cardiovascular negative cardio ROS Normal cardiovascular exam     Neuro/Psych    GI/Hepatic negative GI ROS, Neg liver ROS,   Endo/Other  negative endocrine ROS  Renal/GU negative Renal ROS  negative genitourinary   Musculoskeletal   Abdominal   Peds  Hematology negative hematology ROS (+)   Anesthesia Other Findings   Reproductive/Obstetrics                            Anesthesia Physical Anesthesia Plan  ASA: III  Anesthesia Plan: General   Post-op Pain Management:    Induction:   PONV Risk Score and Plan:   Airway Management Planned:   Additional Equipment:   Intra-op Plan:   Post-operative Plan:   Informed Consent: I have reviewed the patients History and Physical, chart, labs and discussed the procedure including the risks, benefits and alternatives for the proposed anesthesia with the patient or authorized representative who has indicated his/her understanding and acceptance.     Plan Discussed with:   Anesthesia Plan Comments:         Anesthesia Quick Evaluation

## 2016-12-17 NOTE — Transfer of Care (Signed)
Immediate Anesthesia Transfer of Care Note  Patient: Taylor Ray  Procedure(s) Performed: Procedure(s) with comments: COLONOSCOPY WITH PROPOFOL (N/A) - leave patient 9:15 BIOPSY (N/A)  Patient Location: PACU  Anesthesia Type: General  Level of Consciousness: awake, alert  and patient cooperative  Airway and Oxygen Therapy: Patient Spontanous Breathing and Patient connected to supplemental oxygen  Post-op Assessment: Post-op Vital signs reviewed, Patient's Cardiovascular Status Stable, Respiratory Function Stable, Patent Airway and No signs of Nausea or vomiting  Post-op Vital Signs: Reviewed and stable  Complications: No apparent anesthesia complications

## 2016-12-18 ENCOUNTER — Encounter: Payer: Self-pay | Admitting: Gastroenterology

## 2016-12-18 ENCOUNTER — Telehealth: Payer: Self-pay | Admitting: Gastroenterology

## 2016-12-18 NOTE — Telephone Encounter (Signed)
Patient would like a prescription of Chantix to stop smoking. She does not have a primary care doctor. She uses Paediatric nurse on [facility]. She also doesn't  Have insurance so she would appreciate a coupon if possible.

## 2016-12-21 ENCOUNTER — Encounter: Payer: Self-pay | Admitting: Gastroenterology

## 2016-12-21 NOTE — Telephone Encounter (Signed)
I contacted pt to let her know that there is a program called "Quit Smart" that [facility] offers to for patients who want to quit smoking.  I left the phone number for the program so that she could contact them for further information. This information was left on her home voice mail.

## 2016-12-24 ENCOUNTER — Encounter: Payer: Self-pay | Admitting: Gastroenterology

## 2016-12-24 ENCOUNTER — Telehealth: Payer: Self-pay

## 2016-12-24 NOTE — Telephone Encounter (Signed)
Called patient at this time left message letting her know that we would not be able to prescribe her a refill of Chantix. That she would have to contact her primary care doctor in order to get this medication filled.

## 2016-12-24 NOTE — Telephone Encounter (Signed)
Patient was seen last by Dr. Dahlia Byes on 11/10/16. In this visit, it was discussed that she would need a full colonoscopy and needs quit smoking and control her weight before having major elective surgery.   Patient is taking Chantix that she got from her friend who wasn't using it. She started as soon as she left the visit. She had one month supply and is wondering if she can possibly be prescribed another months worth. She had her colonoscopy on 12/17/16. Please call patient and advice.

## 2017-01-08 ENCOUNTER — Encounter: Payer: Self-pay | Admitting: Surgery

## 2017-01-08 ENCOUNTER — Ambulatory Visit (INDEPENDENT_AMBULATORY_CARE_PROVIDER_SITE_OTHER): Payer: Self-pay | Admitting: Surgery

## 2017-01-08 VITALS — BP 159/95 | HR 59 | Temp 98.5°F | Ht 64.0 in | Wt 251.2 lb

## 2017-01-08 DIAGNOSIS — Z09 Encounter for follow-up examination after completed treatment for conditions other than malignant neoplasm: Secondary | ICD-10-CM

## 2017-01-08 MED ORDER — VARENICLINE TARTRATE 0.5 MG X 11 & 1 MG X 42 PO MISC: ORAL | 0 refills | Status: DC

## 2017-01-08 MED ORDER — VARENICLINE TARTRATE 1 MG PO TABS: 1.0000 mg | ORAL_TABLET | Freq: Two times a day (BID) | ORAL | 0 refills | Status: AC

## 2017-01-08 MED ORDER — POLYETHYLENE GLYCOL POWD: 17.0000 g | Freq: Every day | 5 refills | Status: AC

## 2017-01-08 MED ORDER — VARENICLINE TARTRATE 0.5 MG X 11 & 1 MG X 42 PO MISC: ORAL | 0 refills | Status: AC

## 2017-01-08 NOTE — Addendum Note (Signed)
Addended by: Phillips Odor on: 01/08/2017 02:31 PM   Modules accepted: Orders

## 2017-01-08 NOTE — Progress Notes (Signed)
Outpatient Surgical Follow Up  01/08/2017  Taylor Ray is an 56 y.o. female.   Chief Complaint  Patient presents with  . Follow-up    Diverticulitis    HPI: Patient with a history of diverticular abscess treated with antibiotic therapy status post colonoscopy showing diverticulosis TA sigmoid colon was removed. She is doing well with no evidence of recurrent disease. Tolerating regular diet and no abdominal pain. No fevers or chills. Taking Fiber  Past Medical History:  Diagnosis Date  . Arthritis    legs, hips  . Diverticulitis   . Diverticulitis of colon 10/26/2016  . Hyperglycemia 10/26/2016  . Lumbar radiculopathy 10/29/2016  . Numbness and tingling of left lower extremity   . Sigmoid diverticulitis 10/26/2016  . Spinal stenosis, lumbar 10/29/2016  . Tobacco abuse counseling 10/29/2016  . Wears dentures    partial lower    Past Surgical History:  Procedure Laterality Date  . ABLATION ON ENDOMETRIOSIS N/A 2003  . BIOPSY N/A 12/17/2016   Procedure: BIOPSY;  Surgeon: Lucilla Lame, MD;  Location: [place];  Service: Endoscopy;  Laterality: N/A;  . COLONOSCOPY WITH PROPOFOL N/A 12/17/2016   Procedure: COLONOSCOPY WITH PROPOFOL;  Surgeon: Lucilla Lame, MD;  Location: [place];  Service: Endoscopy;  Laterality: N/A;  leave patient 9:15  . TONSILLECTOMY  1973    Family History  Problem Relation Age of Onset  . Hypertension Mother   . Uterine cancer Mother   . Gout Father   . Hypertension Maternal Grandmother     Social History:  reports that she has been smoking Cigarettes and E-cigarettes.  She has a 10.00 pack-year smoking history. She has never used smokeless tobacco. She reports that she does not drink alcohol or use drugs.  Allergies:  Allergies  Allergen Reactions  . Benadryl [Diphenhydramine Hcl] Hives and Anxiety  . Penicillins Other (See Comments)    sts allergy from childhood- unknown reaction    Medications reviewed.    ROS Full  ROS performed and is otherwise negative other than what is stated in HPI   BP (!) 159/95   Pulse (!) 59   Temp 98.5 F (36.9 C) (Oral)   Ht 5\' 4"  (1.626 m)   Wt 113.9 kg (251 lb 3.2 oz)   BMI 43.12 kg/m    Physical Exam  Constitutional: She is oriented to person, place, and time and well-developed, well-nourished, and in no distress. No distress.  Pulmonary/Chest: Effort normal. No respiratory distress.  Abdominal: Soft. She exhibits no distension and no mass. There is no tenderness. There is no rebound and no guarding.  Neurological: She is alert and oriented to person, place, and time. Gait normal. GCS score is 15.  Skin: Skin is warm and dry.  Psychiatric: Mood, memory, affect and judgment normal.    Assessment/Plan: Hx of recurrent complicated diverticulitis ( abscess). Patient continues to smoke discussed in detail about the importance of smoking cessation for elective sigmoid colectomy. Also encourage patient to lose weight. I will see her back in about 6 weeks for further discussion of sigmoid colectomy. Prescribed Chantix for smoking cessation, at [facility] for constipation.    Caroleen Hamman, MD [facility] General Surgeon

## 2017-01-08 NOTE — Patient Instructions (Addendum)
We will add Miralax to your Regimen with your Benefiber. It is slightly stronger and works in a different way than [employer] does. You will need to continue to drink at least 72 oz. Of water daily to get this medication to work most effectively.  We are excited to hear you have decided to stop smoking. We have provided you with a prescription for Chantix. This has been sent to your pharmacy.  Please fill out all financial paperwork given today and send in to appropriate offices. Also stop by the [facility] office and apply for medicaid to see if any of this can help with your financial and health situation at this time.  Follow-up in our office in 2 months. Please call our office with any questions or concerns prior to this appointment. See appointment information below.    Varenicline oral tablets What is this medicine? VARENICLINE (var EN i kleen) is used to help people quit smoking. It can reduce the symptoms caused by stopping smoking. It is used with a patient support program recommended by your physician. This medicine may be used for other purposes; ask your health care provider or pharmacist if you have questions. COMMON BRAND NAME(S): Chantix What should I tell my health care provider before I take this medicine? They need to know if you have any of these conditions: -bipolar disorder, depression, schizophrenia or other mental illness -heart disease -if you often drink alcohol -kidney disease -peripheral vascular disease -seizures -stroke -suicidal thoughts, plans, or attempt; a previous suicide attempt by you or a family member -an unusual or allergic reaction to varenicline, other medicines, foods, dyes, or preservatives -pregnant or trying to get pregnant -breast-feeding How should I use this medicine? Take this medicine by mouth after eating. Take with a full glass of water. Follow the directions on the prescription label. Take your doses at regular intervals. Do not take  your medicine more often than directed. There are 3 ways you can use this medicine to help you quit smoking; talk to your health care professional to decide which plan is right for you: 1) you can choose a quit date and start this medicine 1 week before the quit date, or, 2) you can start taking this medicine before you choose a quit date, and then pick a quit date between day 8 and 35 days of treatment, or, 3) if you are not sure that you are able or willing to quit smoking right away, start taking this medicine and slowly decrease the amount you smoke as directed by your health care professional with the goal of being cigarette-free by week 12 of treatment. Stick to your plan; ask about support groups or other ways to help you remain cigarette-free. If you are motivated to quit smoking and did not succeed during a previous attempt with this medicine for reasons other than side effects, or if you returned to smoking after this treatment, speak with your health care professional about whether another course of this medicine may be right for you. A special MedGuide will be given to you by the pharmacist with each prescription and refill. Be sure to read this information carefully each time. Talk to your pediatrician regarding the use of this medicine in children. This medicine is not approved for use in children. Overdosage: If you think you have taken too much of this medicine contact a poison control center or emergency room at once. NOTE: This medicine is only for you. Do not share this medicine with others.  What if I miss a dose? If you miss a dose, take it as soon as you can. If it is almost time for your next dose, take only that dose. Do not take double or extra doses. What may interact with this medicine? -alcohol or any product that contains alcohol -insulin -other stop smoking aids -theophylline -warfarin This list may not describe all possible interactions. Give your health care provider  a list of all the medicines, herbs, non-prescription drugs, or dietary supplements you use. Also tell them if you smoke, drink alcohol, or use illegal drugs. Some items may interact with your medicine. What should I watch for while using this medicine? Visit your doctor or health care professional for regular check ups. Ask for ongoing advice and encouragement from your doctor or healthcare professional, friends, and family to help you quit. If you smoke while on this medication, quit again Your mouth may get dry. Chewing sugarless gum or sucking hard candy, and drinking plenty of water may help. Contact your doctor if the problem does not go away or is severe. You may get drowsy or dizzy. Do not drive, use machinery, or do anything that needs mental alertness until you know how this medicine affects you. Do not stand or sit up quickly, especially if you are an older patient. This reduces the risk of dizzy or fainting spells. Sleepwalking can happen during treatment with this medicine, and can sometimes lead to behavior that is harmful to you, other people, or property. Stop taking this medicine and tell your doctor if you start sleepwalking or have other unusual sleep-related activity. Decrease the amount of alcoholic beverages that you drink during treatment with this medicine until you know if this medicine affects your ability to tolerate alcohol. Some people have experienced increased drunkenness (intoxication), unusual or sometimes aggressive behavior, or no memory of things that have happened (amnesia) during treatment with this medicine. The use of this medicine may increase the chance of suicidal thoughts or actions. Pay special attention to how you are responding while on this medicine. Any worsening of mood, or thoughts of suicide or dying should be reported to your health care professional right away. What side effects may I notice from receiving this medicine? Side effects that you should  report to your doctor or health care professional as soon as possible: -allergic reactions like skin rash, itching or hives, swelling of the face, lips, tongue, or throat -acting aggressive, being angry or violent, or acting on dangerous impulses -breathing problems -changes in vision -chest pain or chest tightness -confusion, trouble speaking or understanding -new or worsening depression, anxiety, or panic attacks -extreme increase in activity and talking (mania) -fast, irregular heartbeat -feeling faint or lightheaded, falls -fever -pain in legs when walking -problems with balance, talking, walking -redness, blistering, peeling or loosening of the skin, including inside the mouth -ringing in ears -seeing or hearing things that aren't there (hallucinations) -seizures -sleepwalking -sudden numbness or weakness of the face, arm or leg -thoughts about suicide or dying, or attempts to commit suicide -trouble passing urine or change in the amount of urine -unusual bleeding or bruising -unusually weak or tired Side effects that usually do not require medical attention (report to your doctor or health care professional if they continue or are bothersome): -constipation -headache -nausea, vomiting -strange dreams -stomach gas -trouble sleeping This list may not describe all possible side effects. Call your doctor for medical advice about side effects. You may report side effects to FDA at 1-800-FDA-1088.  Where should I keep my medicine? Keep out of the reach of children. Store at room temperature between 15 and 30 degrees C (59 and 86 degrees F). Throw away any unused medicine after the expiration date. NOTE: This sheet is a summary. It may not cover all possible information. If you have questions about this medicine, talk to your doctor, pharmacist, or health care provider.  2018 Elsevier/Gold Standard (2015-02-28 16:14:23)    Coping with Quitting Smoking Quitting smoking is a  physical and mental challenge. You will face cravings, withdrawal symptoms, and temptation. Before quitting, work with your health care provider to make a plan that can help you cope. Preparation can help you quit and keep you from giving in. How can I cope with cravings? Cravings usually last for 5-10 minutes. If you get through it, the craving will pass. Consider taking the following actions to help you cope with cravings:  Keep your mouth busy: ? Chew sugar-free gum. ? Suck on hard candies or a straw. ? Brush your teeth.  Keep your hands and body busy: ? Immediately change to a different activity when you feel a craving. ? Squeeze or play with a ball. ? Do an activity or a hobby, like making bead jewelry, practicing needlepoint, or working with wood. ? Mix up your normal routine. ? Take a short exercise break. Go for a quick walk or run up and down stairs. ? Spend time in public places where smoking is not allowed.  Focus on doing something kind or helpful for someone else.  Call a friend or family member to talk during a craving.  Join a support group.  Call a quit line, such as 1-800-QUIT-NOW.  Talk with your health care provider about medicines that might help you cope with cravings and make quitting easier for you.  How can I deal with withdrawal symptoms? Your body may experience negative effects as it tries to get used to not having nicotine in the system. These effects are called withdrawal symptoms. They may include:  Feeling hungrier than normal.  Trouble concentrating.  Irritability.  Trouble sleeping.  Feeling depressed.  Restlessness and agitation.  Craving a cigarette.  To manage withdrawal symptoms:  Avoid places, people, and activities that trigger your cravings.  Remember why you want to quit.  Get plenty of sleep.  Avoid coffee and other caffeinated drinks. These may worsen some of your symptoms.  How can I handle social situations? Social  situations can be difficult when you are quitting smoking, especially in the first few weeks. To manage this, you can:  Avoid parties, bars, and other social situations where people might be smoking.  Avoid alcohol.  Leave right away if you have the urge to smoke.  Explain to your family and friends that you are quitting smoking. Ask for understanding and support.  Plan activities with friends or family where smoking is not an option.  What are some ways I can cope with stress? Wanting to smoke may cause stress, and stress can make you want to smoke. Find ways to manage your stress. Relaxation techniques can help. For example:  Breathe slowly and deeply, in through your nose and out through your mouth.  Listen to soothing, relaxing music.  Talk with a family member or friend about your stress.  Light a candle.  Soak in a bath or take a shower.  Think about a peaceful place.  What are some ways I can prevent weight gain? Be aware that many people  gain weight after they quit smoking. However, not everyone does. To keep from gaining weight, have a plan in place before you quit and stick to the plan after you quit. Your plan should include:  Having healthy snacks. When you have a craving, it may help to: ? Eat plain popcorn, crunchy carrots, celery, or other cut vegetables. ? Chew sugar-free gum.  Changing how you eat: ? Eat small portion sizes at meals. ? Eat 4-6 small meals throughout the day instead of 1-2 large meals a day. ? Be mindful when you eat. Do not watch television or do other things that might distract you as you eat.  Exercising regularly: ? Make time to exercise each day. If you do not have time for a long workout, do short bouts of exercise for 5-10 minutes several times a day. ? Do some form of strengthening exercise, like weight lifting, and some form of aerobic exercise, like running or swimming.  Drinking plenty of water or other low-calorie or no-calorie  drinks. Drink 6-8 glasses of water daily, or as much as instructed by your health care provider.  Summary  Quitting smoking is a physical and mental challenge. You will face cravings, withdrawal symptoms, and temptation to smoke again. Preparation can help you as you go through these challenges.  You can cope with cravings by keeping your mouth busy (such as by chewing gum), keeping your body and hands busy, and making calls to family, friends, or a helpline for people who want to quit smoking.  You can cope with withdrawal symptoms by avoiding places where people smoke, avoiding drinks with caffeine, and getting plenty of rest.  Ask your health care provider about the different ways to prevent weight gain, avoid stress, and handle social situations. This information is not intended to replace advice given to you by your health care provider. Make sure you discuss any questions you have with your health care provider. Document Released: 06/12/2016 Document Revised: 06/12/2016 Document Reviewed: 06/12/2016 Elsevier Interactive Patient Education  2018 [place].     Polyethylene Glycol powder What is this medicine? POLYETHYLENE GLYCOL 3350 (pol ee ETH i leen; GLYE col) powder is a laxative used to treat constipation. It increases the amount of water in the stool. Bowel movements become easier and more frequent. This medicine may be used for other purposes; ask your health care provider or pharmacist if you have questions. COMMON BRAND NAME(S): Sharlyn Bologna, GlycoLax, MiraLax, Smooth LAX, [employer] What should I tell my health care provider before I take this medicine? They need to know if you have any of these conditions: -a history of blockage of the stomach or intestine -current abdomen distension or pain -difficulty swallowing -diverticulitis, ulcerative colitis, or other chronic bowel disease -phenylketonuria -an unusual or allergic reaction to polyethylene glycol, other  medicines, dyes, or preservatives -pregnant or trying to get pregnant -breast-feeding How should I use this medicine? Take this medicine by mouth. The bottle has a measuring cap that is marked with a line. Pour the powder into the cap up to the marked line (the dose is about 1 heaping tablespoon). Add the powder in the cap to a full glass (4 to 8 ounces or 120 to 240 ml) of water, juice, soda, coffee or tea. Mix the powder well. Drink the solution. Take exactly as directed. Do not take your medicine more often than directed. Talk to your pediatrician regarding the use of this medicine in children. Special care may be needed. Overdosage: If  you think you have taken too much of this medicine contact a poison control center or emergency room at once. NOTE: This medicine is only for you. Do not share this medicine with others. What if I miss a dose? If you miss a dose, take it as soon as you can. If it is almost time for your next dose, take only that dose. Do not take double or extra doses. What may interact with this medicine? Interactions are not expected. This list may not describe all possible interactions. Give your health care provider a list of all the medicines, herbs, non-prescription drugs, or dietary supplements you use. Also tell them if you smoke, drink alcohol, or use illegal drugs. Some items may interact with your medicine. What should I watch for while using this medicine? Do not use for more than 2 weeks without advice from your doctor or health care professional. It can take 2 to 4 days to have a bowel movement and to experience improvement in constipation. See your health care professional for any changes in bowel habits, including constipation, that are severe or last longer than three weeks. Always take this medicine with plenty of water. What side effects may I notice from receiving this medicine? Side effects that you should report to your doctor or health care professional as  soon as possible: -diarrhea -difficulty breathing -itching of the skin, hives, or skin rash -severe bloating, pain, or distension of the stomach -vomiting Side effects that usually do not require medical attention (report to your doctor or health care professional if they continue or are bothersome): -bloating or gas -lower abdominal discomfort or cramps -nausea This list may not describe all possible side effects. Call your doctor for medical advice about side effects. You may report side effects to FDA at 1-800-FDA-1088. Where should I keep my medicine? Keep out of the reach of children. Store between 15 and 30 degrees C (59 and 86 degrees F). Throw away any unused medicine after the expiration date. NOTE: This sheet is a summary. It may not cover all possible information. If you have questions about this medicine, talk to your doctor, pharmacist, or health care provider.  2018 Elsevier/Gold Standard (2008-01-16 16:50:45)

## 2017-03-17 ENCOUNTER — Ambulatory Visit: Payer: Self-pay | Admitting: Surgery

## 2017-04-14 ENCOUNTER — Other Ambulatory Visit: Payer: Self-pay | Admitting: Surgery

## 2017-04-14 MED ORDER — AMOXICILLIN-POT CLAVULANATE 875-125 MG PO TABS: 1.0000 | ORAL_TABLET | Freq: Two times a day (BID) | ORAL | 0 refills | Status: AC

## 2017-04-14 MED ORDER — METRONIDAZOLE 500 MG PO TABS: 500.0000 mg | ORAL_TABLET | Freq: Three times a day (TID) | ORAL | 0 refills | Status: AC

## 2017-04-14 NOTE — Telephone Encounter (Signed)
Called patient back and she stated that she was having abdominal pain and loose stools since last Thursday 04/08/2017. Patient denied fever, chills and vomiting. Patient was last seen on 01/08/2017 by Dr. Dahlia Byes for diverticulitis. Patient was instructed to give [place] a call in case she felt that she was having a flare-up, therefore, she did. I spoke with Dr. Adonis Huguenin and explained what was going on and authorized for me to send her antibiotics. Patient stated that she had moved to [place], [place] so to e-scribe her antibiotic to her local [facility]. Patient was recommended to go to her local ED if her pain persisted while taking the antibiotics. Patient agreed and had no further questions.

## 2017-04-14 NOTE — Telephone Encounter (Signed)
Patient is calling and complaining of some stomach pain, pain level being a 6 or 7 during the day. Patient has had a little diarrhea, denies nausea. 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 Patient was seen on 01/08/17 by Dr. Dahlia Byes for Diverticulitis  Please call patient and advice.

## 2017-06-26 IMAGING — CT CT ABD-PELV W/ CM
2 of 5 series · 16 of 46 positions shown, 18 images · IV contrast (APPLIED)
Comparison: 10/01/2016

CLINICAL DATA: Lower abdominal pain with vomiting for 1 week.
History diverticulitis.

EXAM:
CT ABDOMEN AND PELVIS WITH CONTRAST
TECHNIQUE: Multidetector CT imaging of the abdomen and pelvis was performed
using the standard protocol following bolus administration of
intravenous contrast.
CONTRAST:  <See Chart> 2843PH-IVV IOPAMIDOL (2843PH-IVV) INJECTION
61%, <See Chart> 2843PH-IVV IOPAMIDOL (2843PH-IVV) INJECTION 61%

[Series 2: axial st · axial · 0.91mm/px · z∈[-512,-67]mm · 13 of 101 slices shown, 15 images]
[im 6/101  soft-tissue]
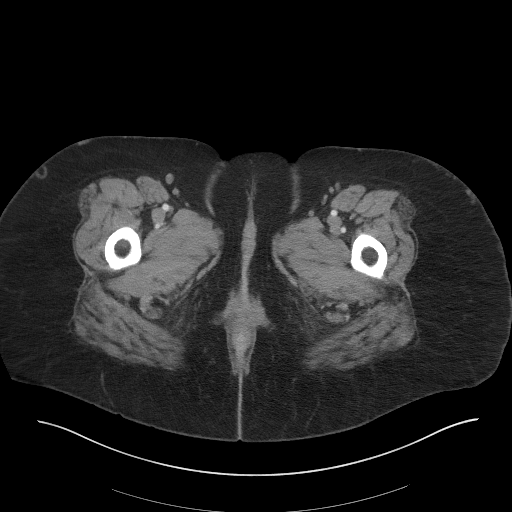
[im 6/101  bone]
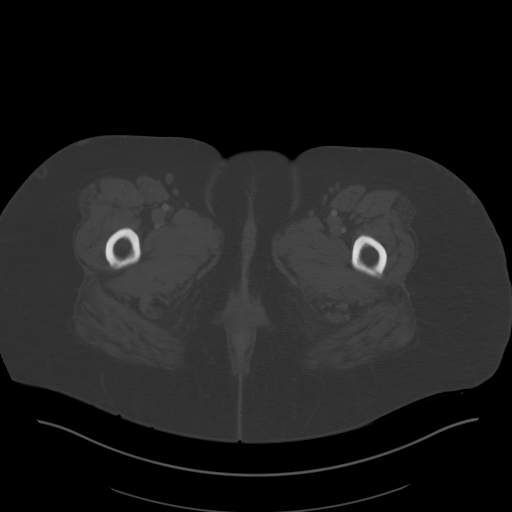
[im 12/101  soft-tissue]
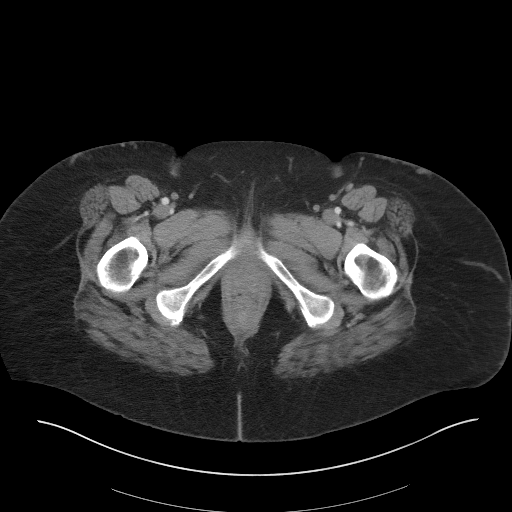
[im 23/101  soft-tissue]
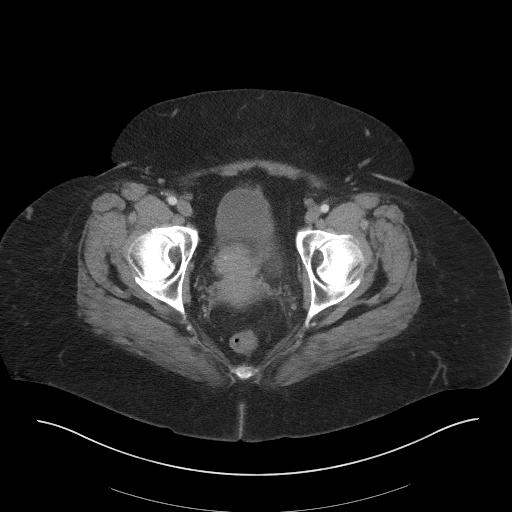
[im 28/101  soft-tissue]
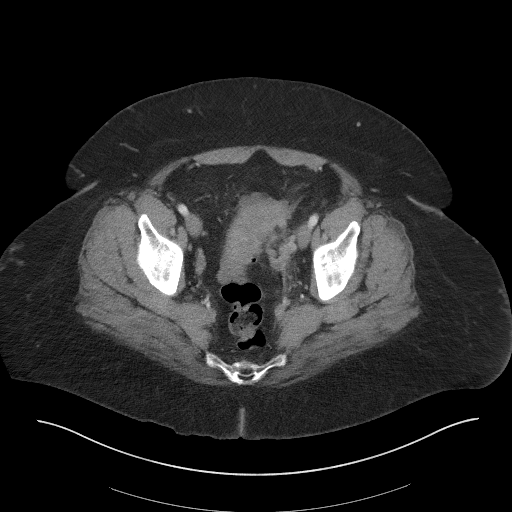
[im 34/101  soft-tissue]
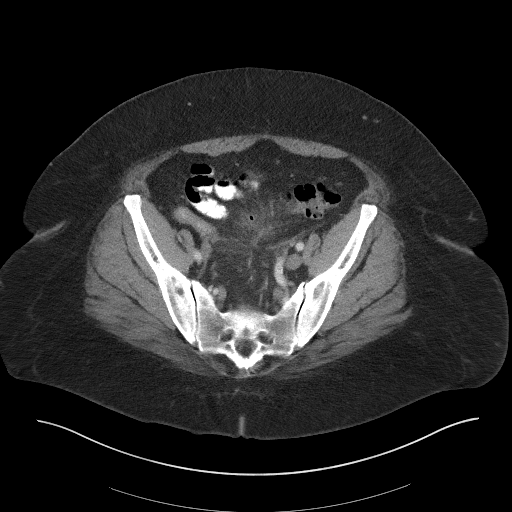
[im 45/101  soft-tissue]
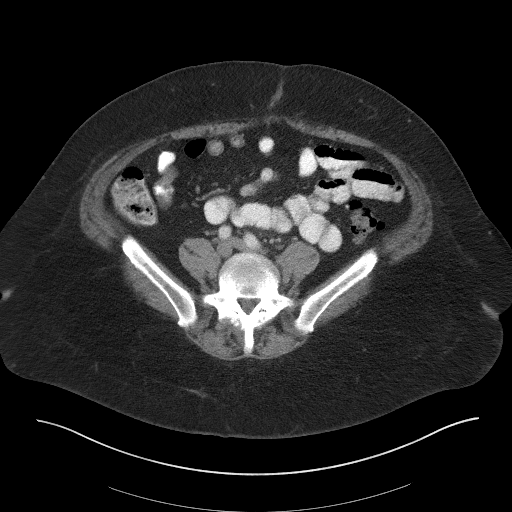
[im 51/101  soft-tissue]
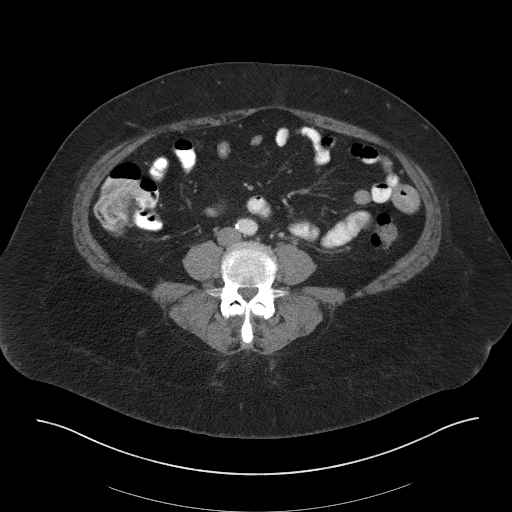
[im 56/101  soft-tissue]
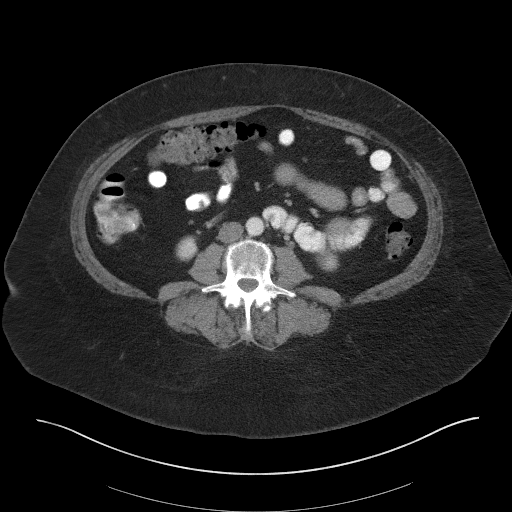
[im 67/101  soft-tissue]
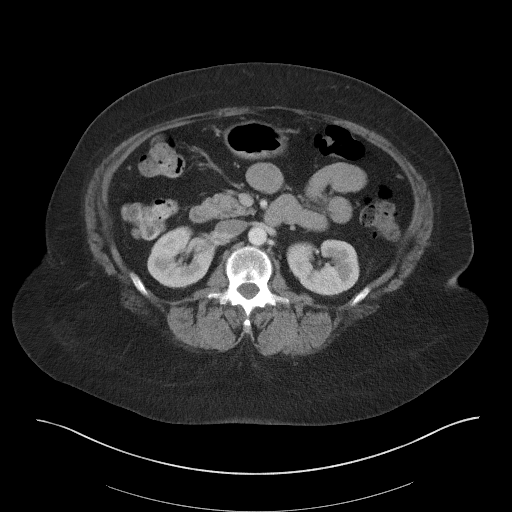
[im 67/101  bone]
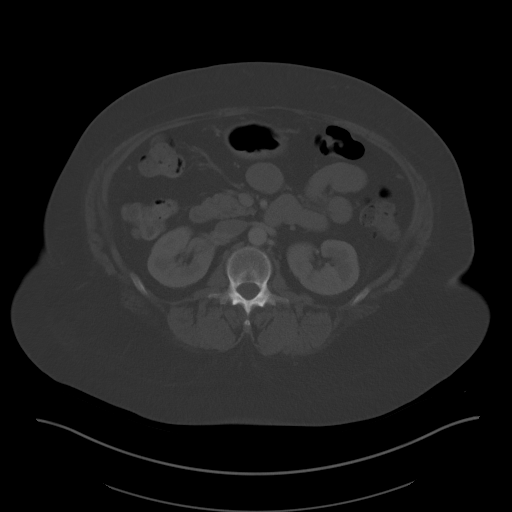
[im 73/101  soft-tissue]
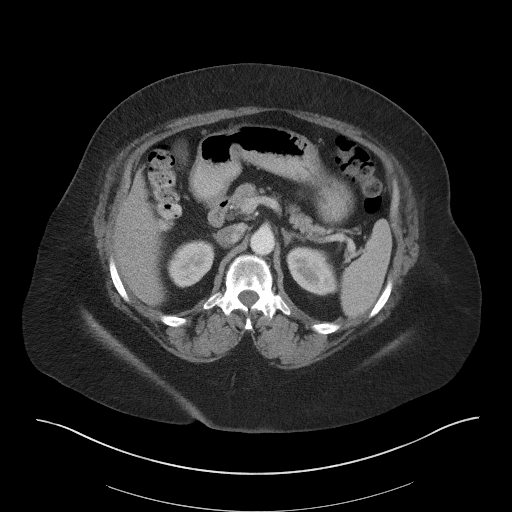
[im 78/101  soft-tissue]
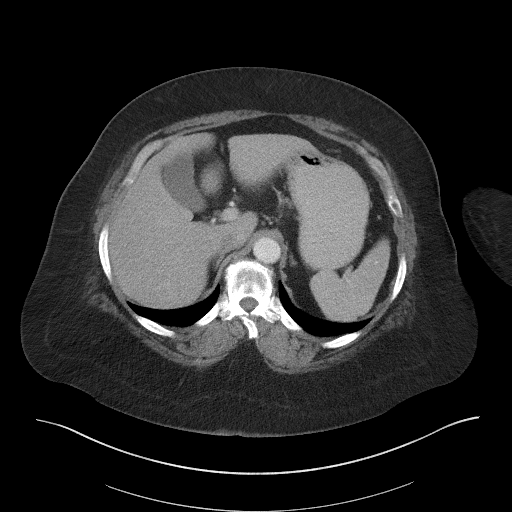
[im 89/101  soft-tissue]
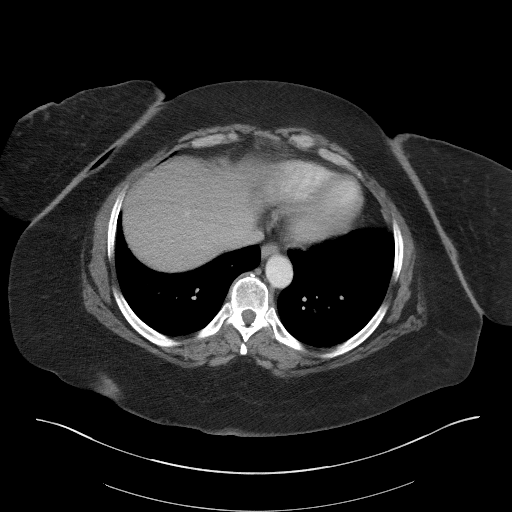
[im 95/101  soft-tissue]
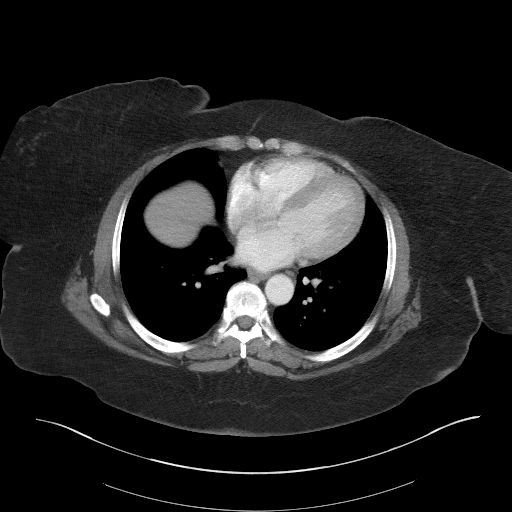

[Series 5: coronal st · coronal · 0.87mm/px · 3 of 102 slices shown]
[im 34/102  soft-tissue]
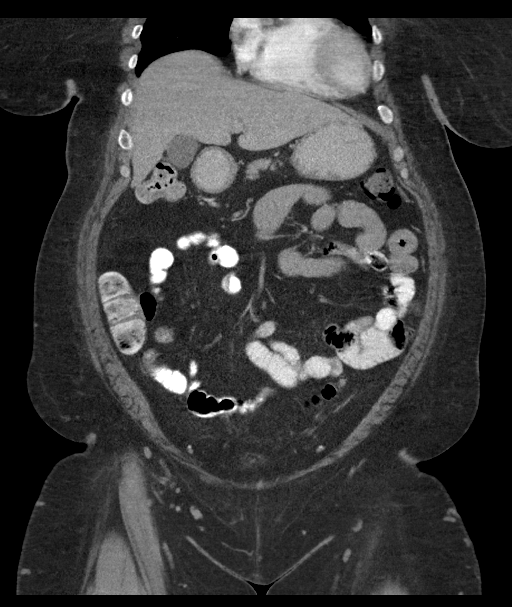
[im 45/102  soft-tissue]
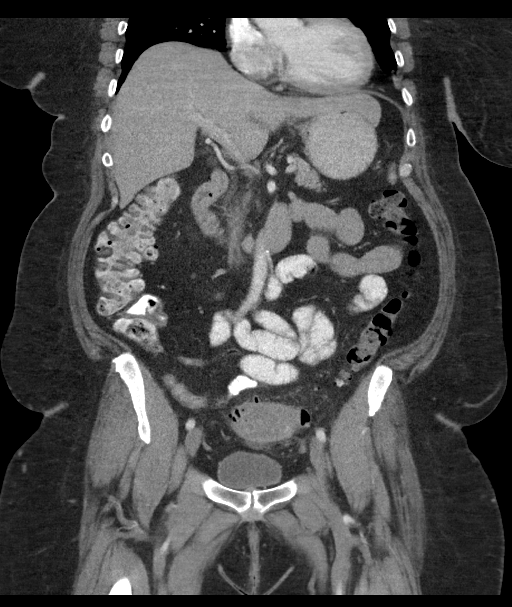
[im 57/102  soft-tissue]
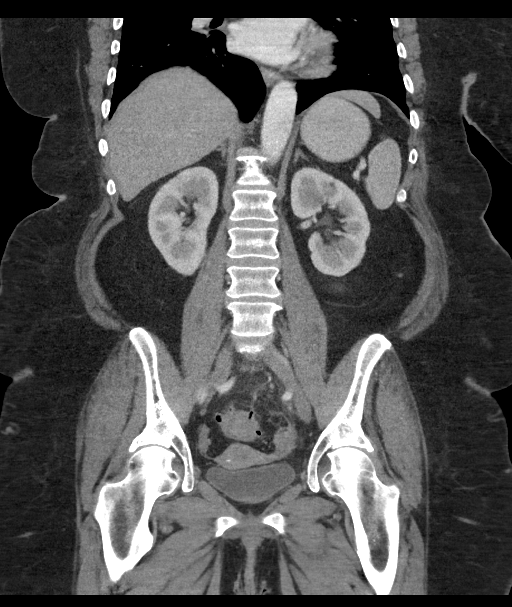

[16 of 46 positions shown; findings below may reference images not displayed]

FINDINGS: Lower chest: Clear lung bases. Mild cardiomegaly, without
pericardial or pleural effusion.

Hepatobiliary: Normal liver. Normal gallbladder, without biliary
ductal dilatation.

Pancreas: Normal, without mass or ductal dilatation.

Spleen: Normal in size, without focal abnormality.

Adrenals/Urinary Tract: Normal adrenal glands. Upper pole left renal
cyst and too small to characterize lesion. Normal right kidney.
Normal urinary bladder.

Stomach/Bowel: Normal stomach, without wall thickening.

Colonic diverticulosis. Wall thickening of and inflammation
surrounding the sigmoid are again identified. Minimally improved
since 10/01/2016. Again identified is a collection within the
posterior wall of the sigmoid which measures 2.1 cm and slightly
greater than fluid density on image 71/series 2. This is similar to
on the prior exam (when remeasured). No extraluminal gas or fluid
collection. Normal terminal ileum and appendix.

Vascular/Lymphatic: Aortic and branch vessel atherosclerosis. No
abdominopelvic adenopathy.

Reproductive: Normal uterus and adnexa.

Other: No significant free fluid.

Musculoskeletal: Degenerative disc disease at L5-S1. Grade 1 L3-4
anterolisthesis.
IMPRESSION: 1. Recurrent or persistent diverticulitis involving the sigmoid.
Similar appearance of a collection of hypoattenuation in the
posterior sigmoid wall, suspicious for intramural phlegmon or
developing abscess.
2.  Aortic atherosclerosis.

## 2020-04-10 ENCOUNTER — Telehealth: Payer: Self-pay

## 2020-04-10 NOTE — Telephone Encounter (Signed)
Telephoned patient at home number. Left a voice message with [facility] contact information. 

## 2022-08-06 DIAGNOSIS — L03115 Cellulitis of right lower limb: Secondary | ICD-10-CM | POA: Diagnosis not present

## 2022-08-06 DIAGNOSIS — M25571 Pain in right ankle and joints of right foot: Secondary | ICD-10-CM | POA: Diagnosis not present

## 2022-08-06 DIAGNOSIS — M109 Gout, unspecified: Secondary | ICD-10-CM | POA: Diagnosis not present

## 2022-08-13 DIAGNOSIS — L03115 Cellulitis of right lower limb: Secondary | ICD-10-CM | POA: Diagnosis not present

## 2022-08-13 DIAGNOSIS — M7989 Other specified soft tissue disorders: Secondary | ICD-10-CM | POA: Diagnosis not present

## 2022-08-13 DIAGNOSIS — R600 Localized edema: Secondary | ICD-10-CM | POA: Diagnosis not present

## 2022-11-15 DIAGNOSIS — J811 Chronic pulmonary edema: Secondary | ICD-10-CM | POA: Diagnosis not present

## 2022-11-15 DIAGNOSIS — J189 Pneumonia, unspecified organism: Secondary | ICD-10-CM | POA: Diagnosis not present

## 2022-11-15 DIAGNOSIS — R079 Chest pain, unspecified: Secondary | ICD-10-CM | POA: Diagnosis not present

## 2022-11-15 DIAGNOSIS — J90 Pleural effusion, not elsewhere classified: Secondary | ICD-10-CM | POA: Diagnosis not present

## 2022-11-15 DIAGNOSIS — M5416 Radiculopathy, lumbar region: Secondary | ICD-10-CM | POA: Diagnosis not present

## 2022-11-15 DIAGNOSIS — I10 Essential (primary) hypertension: Secondary | ICD-10-CM | POA: Diagnosis not present

## 2022-11-19 DIAGNOSIS — M5416 Radiculopathy, lumbar region: Secondary | ICD-10-CM | POA: Diagnosis not present

## 2022-11-19 DIAGNOSIS — M48062 Spinal stenosis, lumbar region with neurogenic claudication: Secondary | ICD-10-CM | POA: Diagnosis not present

## 2022-11-23 DIAGNOSIS — R030 Elevated blood-pressure reading, without diagnosis of hypertension: Secondary | ICD-10-CM | POA: Diagnosis not present

## 2022-11-23 DIAGNOSIS — Z833 Family history of diabetes mellitus: Secondary | ICD-10-CM | POA: Diagnosis not present

## 2022-11-23 DIAGNOSIS — Z79891 Long term (current) use of opiate analgesic: Secondary | ICD-10-CM | POA: Diagnosis not present

## 2022-11-23 DIAGNOSIS — Z7952 Long term (current) use of systemic steroids: Secondary | ICD-10-CM | POA: Diagnosis not present

## 2022-11-23 DIAGNOSIS — Z823 Family history of stroke: Secondary | ICD-10-CM | POA: Diagnosis not present

## 2022-11-23 DIAGNOSIS — Z791 Long term (current) use of non-steroidal anti-inflammatories (NSAID): Secondary | ICD-10-CM | POA: Diagnosis not present

## 2022-11-23 DIAGNOSIS — R600 Localized edema: Secondary | ICD-10-CM | POA: Diagnosis not present

## 2022-11-23 DIAGNOSIS — F17210 Nicotine dependence, cigarettes, uncomplicated: Secondary | ICD-10-CM | POA: Diagnosis not present

## 2022-11-23 DIAGNOSIS — G629 Polyneuropathy, unspecified: Secondary | ICD-10-CM | POA: Diagnosis not present

## 2022-11-23 DIAGNOSIS — Z8249 Family history of ischemic heart disease and other diseases of the circulatory system: Secondary | ICD-10-CM | POA: Diagnosis not present

## 2022-11-23 DIAGNOSIS — M62838 Other muscle spasm: Secondary | ICD-10-CM | POA: Diagnosis not present

## 2022-11-23 DIAGNOSIS — Z825 Family history of asthma and other chronic lower respiratory diseases: Secondary | ICD-10-CM | POA: Diagnosis not present

## 2022-12-03 DIAGNOSIS — M5416 Radiculopathy, lumbar region: Secondary | ICD-10-CM | POA: Diagnosis not present

## 2022-12-03 DIAGNOSIS — M47816 Spondylosis without myelopathy or radiculopathy, lumbar region: Secondary | ICD-10-CM | POA: Diagnosis not present

## 2022-12-03 DIAGNOSIS — M4316 Spondylolisthesis, lumbar region: Secondary | ICD-10-CM | POA: Diagnosis not present

## 2022-12-03 DIAGNOSIS — M48062 Spinal stenosis, lumbar region with neurogenic claudication: Secondary | ICD-10-CM | POA: Diagnosis not present

## 2022-12-03 DIAGNOSIS — M48061 Spinal stenosis, lumbar region without neurogenic claudication: Secondary | ICD-10-CM | POA: Diagnosis not present

## 2022-12-10 DIAGNOSIS — M48062 Spinal stenosis, lumbar region with neurogenic claudication: Secondary | ICD-10-CM | POA: Diagnosis not present

## 2022-12-10 DIAGNOSIS — M5416 Radiculopathy, lumbar region: Secondary | ICD-10-CM | POA: Diagnosis not present

## 2023-01-11 DIAGNOSIS — M545 Low back pain, unspecified: Secondary | ICD-10-CM | POA: Diagnosis not present

## 2023-01-11 DIAGNOSIS — M5416 Radiculopathy, lumbar region: Secondary | ICD-10-CM | POA: Diagnosis not present

## 2023-01-11 DIAGNOSIS — M79604 Pain in right leg: Secondary | ICD-10-CM | POA: Diagnosis not present

## 2023-01-11 DIAGNOSIS — M48062 Spinal stenosis, lumbar region with neurogenic claudication: Secondary | ICD-10-CM | POA: Diagnosis not present

## 2023-01-11 DIAGNOSIS — M4316 Spondylolisthesis, lumbar region: Secondary | ICD-10-CM | POA: Diagnosis not present

## 2023-01-11 DIAGNOSIS — M79605 Pain in left leg: Secondary | ICD-10-CM | POA: Diagnosis not present

## 2023-02-03 DIAGNOSIS — M4316 Spondylolisthesis, lumbar region: Secondary | ICD-10-CM | POA: Diagnosis not present

## 2023-02-03 DIAGNOSIS — M47816 Spondylosis without myelopathy or radiculopathy, lumbar region: Secondary | ICD-10-CM | POA: Diagnosis not present

## 2023-02-03 DIAGNOSIS — M48062 Spinal stenosis, lumbar region with neurogenic claudication: Secondary | ICD-10-CM | POA: Diagnosis not present

## 2023-02-03 DIAGNOSIS — M5416 Radiculopathy, lumbar region: Secondary | ICD-10-CM | POA: Diagnosis not present

## 2023-02-06 DIAGNOSIS — N170 Acute kidney failure with tubular necrosis: Secondary | ICD-10-CM | POA: Diagnosis not present

## 2023-02-06 DIAGNOSIS — W06XXXA Fall from bed, initial encounter: Secondary | ICD-10-CM | POA: Diagnosis not present

## 2023-02-06 DIAGNOSIS — J984 Other disorders of lung: Secondary | ICD-10-CM | POA: Diagnosis not present

## 2023-02-06 DIAGNOSIS — A414 Sepsis due to anaerobes: Secondary | ICD-10-CM | POA: Diagnosis not present

## 2023-02-06 DIAGNOSIS — M549 Dorsalgia, unspecified: Secondary | ICD-10-CM | POA: Diagnosis not present

## 2023-02-06 DIAGNOSIS — R739 Hyperglycemia, unspecified: Secondary | ICD-10-CM | POA: Diagnosis not present

## 2023-02-06 DIAGNOSIS — I10 Essential (primary) hypertension: Secondary | ICD-10-CM | POA: Diagnosis not present

## 2023-02-06 DIAGNOSIS — Z888 Allergy status to other drugs, medicaments and biological substances status: Secondary | ICD-10-CM | POA: Diagnosis not present

## 2023-02-06 DIAGNOSIS — R251 Tremor, unspecified: Secondary | ICD-10-CM | POA: Diagnosis not present

## 2023-02-06 DIAGNOSIS — G0490 Encephalitis and encephalomyelitis, unspecified: Secondary | ICD-10-CM | POA: Diagnosis not present

## 2023-02-06 DIAGNOSIS — N179 Acute kidney failure, unspecified: Secondary | ICD-10-CM | POA: Diagnosis not present

## 2023-02-06 DIAGNOSIS — M1990 Unspecified osteoarthritis, unspecified site: Secondary | ICD-10-CM | POA: Diagnosis not present

## 2023-02-06 DIAGNOSIS — E876 Hypokalemia: Secondary | ICD-10-CM | POA: Diagnosis not present

## 2023-02-06 DIAGNOSIS — R9389 Abnormal findings on diagnostic imaging of other specified body structures: Secondary | ICD-10-CM | POA: Diagnosis not present

## 2023-02-06 DIAGNOSIS — K5730 Diverticulosis of large intestine without perforation or abscess without bleeding: Secondary | ICD-10-CM | POA: Diagnosis not present

## 2023-02-06 DIAGNOSIS — R509 Fever, unspecified: Secondary | ICD-10-CM | POA: Diagnosis not present

## 2023-02-06 DIAGNOSIS — F17210 Nicotine dependence, cigarettes, uncomplicated: Secondary | ICD-10-CM | POA: Diagnosis not present

## 2023-02-06 DIAGNOSIS — Z7901 Long term (current) use of anticoagulants: Secondary | ICD-10-CM | POA: Diagnosis not present

## 2023-02-06 DIAGNOSIS — B961 Klebsiella pneumoniae [K. pneumoniae] as the cause of diseases classified elsewhere: Secondary | ICD-10-CM | POA: Diagnosis not present

## 2023-02-06 DIAGNOSIS — R109 Unspecified abdominal pain: Secondary | ICD-10-CM | POA: Diagnosis not present

## 2023-02-06 DIAGNOSIS — I517 Cardiomegaly: Secondary | ICD-10-CM | POA: Diagnosis not present

## 2023-02-06 DIAGNOSIS — G8929 Other chronic pain: Secondary | ICD-10-CM | POA: Diagnosis not present

## 2023-02-06 DIAGNOSIS — N390 Urinary tract infection, site not specified: Secondary | ICD-10-CM | POA: Diagnosis not present

## 2023-02-06 DIAGNOSIS — Z881 Allergy status to other antibiotic agents status: Secondary | ICD-10-CM | POA: Diagnosis not present

## 2023-02-06 DIAGNOSIS — Z6841 Body Mass Index (BMI) 40.0 and over, adult: Secondary | ICD-10-CM | POA: Diagnosis not present

## 2023-02-06 DIAGNOSIS — Y92230 Patient room in hospital as the place of occurrence of the external cause: Secondary | ICD-10-CM | POA: Diagnosis not present

## 2023-02-06 DIAGNOSIS — I4891 Unspecified atrial fibrillation: Secondary | ICD-10-CM | POA: Diagnosis not present

## 2023-02-06 DIAGNOSIS — D509 Iron deficiency anemia, unspecified: Secondary | ICD-10-CM | POA: Diagnosis not present

## 2023-02-06 DIAGNOSIS — Z0389 Encounter for observation for other suspected diseases and conditions ruled out: Secondary | ICD-10-CM | POA: Diagnosis not present

## 2023-02-06 DIAGNOSIS — T39395A Adverse effect of other nonsteroidal anti-inflammatory drugs [NSAID], initial encounter: Secondary | ICD-10-CM | POA: Diagnosis not present

## 2023-02-06 DIAGNOSIS — Z792 Long term (current) use of antibiotics: Secondary | ICD-10-CM | POA: Diagnosis not present

## 2023-02-06 DIAGNOSIS — R4182 Altered mental status, unspecified: Secondary | ICD-10-CM | POA: Diagnosis not present

## 2023-02-06 DIAGNOSIS — R935 Abnormal findings on diagnostic imaging of other abdominal regions, including retroperitoneum: Secondary | ICD-10-CM | POA: Diagnosis not present

## 2023-02-06 DIAGNOSIS — Z452 Encounter for adjustment and management of vascular access device: Secondary | ICD-10-CM | POA: Diagnosis not present

## 2023-02-06 DIAGNOSIS — R0602 Shortness of breath: Secondary | ICD-10-CM | POA: Diagnosis not present

## 2023-02-06 DIAGNOSIS — R5381 Other malaise: Secondary | ICD-10-CM | POA: Diagnosis not present

## 2023-02-07 DIAGNOSIS — G0490 Encephalitis and encephalomyelitis, unspecified: Secondary | ICD-10-CM | POA: Diagnosis not present

## 2023-02-07 DIAGNOSIS — N170 Acute kidney failure with tubular necrosis: Secondary | ICD-10-CM | POA: Diagnosis not present

## 2023-02-07 DIAGNOSIS — A414 Sepsis due to anaerobes: Secondary | ICD-10-CM | POA: Diagnosis not present

## 2023-02-07 DIAGNOSIS — R935 Abnormal findings on diagnostic imaging of other abdominal regions, including retroperitoneum: Secondary | ICD-10-CM | POA: Diagnosis not present

## 2023-02-07 DIAGNOSIS — N179 Acute kidney failure, unspecified: Secondary | ICD-10-CM | POA: Diagnosis not present

## 2023-02-07 DIAGNOSIS — R4182 Altered mental status, unspecified: Secondary | ICD-10-CM | POA: Diagnosis not present

## 2023-02-08 DIAGNOSIS — N170 Acute kidney failure with tubular necrosis: Secondary | ICD-10-CM | POA: Diagnosis not present

## 2023-02-08 DIAGNOSIS — Z452 Encounter for adjustment and management of vascular access device: Secondary | ICD-10-CM | POA: Diagnosis not present

## 2023-02-08 DIAGNOSIS — R509 Fever, unspecified: Secondary | ICD-10-CM | POA: Diagnosis not present

## 2023-02-08 DIAGNOSIS — A414 Sepsis due to anaerobes: Secondary | ICD-10-CM | POA: Diagnosis not present

## 2023-02-08 DIAGNOSIS — I4891 Unspecified atrial fibrillation: Secondary | ICD-10-CM | POA: Diagnosis not present

## 2023-02-08 DIAGNOSIS — I517 Cardiomegaly: Secondary | ICD-10-CM | POA: Diagnosis not present

## 2023-02-08 DIAGNOSIS — G0490 Encephalitis and encephalomyelitis, unspecified: Secondary | ICD-10-CM | POA: Diagnosis not present

## 2023-02-09 DIAGNOSIS — A414 Sepsis due to anaerobes: Secondary | ICD-10-CM | POA: Diagnosis not present

## 2023-02-09 DIAGNOSIS — G0490 Encephalitis and encephalomyelitis, unspecified: Secondary | ICD-10-CM | POA: Diagnosis not present

## 2023-02-09 DIAGNOSIS — N170 Acute kidney failure with tubular necrosis: Secondary | ICD-10-CM | POA: Diagnosis not present

## 2023-02-10 DIAGNOSIS — A414 Sepsis due to anaerobes: Secondary | ICD-10-CM | POA: Diagnosis not present

## 2023-02-10 DIAGNOSIS — Z0389 Encounter for observation for other suspected diseases and conditions ruled out: Secondary | ICD-10-CM | POA: Diagnosis not present

## 2023-02-10 DIAGNOSIS — N170 Acute kidney failure with tubular necrosis: Secondary | ICD-10-CM | POA: Diagnosis not present

## 2023-02-10 DIAGNOSIS — G0490 Encephalitis and encephalomyelitis, unspecified: Secondary | ICD-10-CM | POA: Diagnosis not present

## 2023-02-11 DIAGNOSIS — A414 Sepsis due to anaerobes: Secondary | ICD-10-CM | POA: Diagnosis not present

## 2023-02-11 DIAGNOSIS — R4182 Altered mental status, unspecified: Secondary | ICD-10-CM | POA: Diagnosis not present

## 2023-02-11 DIAGNOSIS — G0490 Encephalitis and encephalomyelitis, unspecified: Secondary | ICD-10-CM | POA: Diagnosis not present

## 2023-02-11 DIAGNOSIS — N170 Acute kidney failure with tubular necrosis: Secondary | ICD-10-CM | POA: Diagnosis not present

## 2023-02-12 DIAGNOSIS — G0490 Encephalitis and encephalomyelitis, unspecified: Secondary | ICD-10-CM | POA: Diagnosis not present

## 2023-02-12 DIAGNOSIS — A414 Sepsis due to anaerobes: Secondary | ICD-10-CM | POA: Diagnosis not present

## 2023-02-12 DIAGNOSIS — R4182 Altered mental status, unspecified: Secondary | ICD-10-CM | POA: Diagnosis not present

## 2023-02-12 DIAGNOSIS — N170 Acute kidney failure with tubular necrosis: Secondary | ICD-10-CM | POA: Diagnosis not present

## 2023-02-13 DIAGNOSIS — A414 Sepsis due to anaerobes: Secondary | ICD-10-CM | POA: Diagnosis not present

## 2023-02-13 DIAGNOSIS — G0490 Encephalitis and encephalomyelitis, unspecified: Secondary | ICD-10-CM | POA: Diagnosis not present

## 2023-02-13 DIAGNOSIS — N170 Acute kidney failure with tubular necrosis: Secondary | ICD-10-CM | POA: Diagnosis not present

## 2023-02-14 ENCOUNTER — Encounter (HOSPITAL_COMMUNITY): Payer: Self-pay

## 2023-02-14 ENCOUNTER — Telehealth: Payer: Self-pay | Admitting: Neurology

## 2023-02-14 DIAGNOSIS — A414 Sepsis due to anaerobes: Secondary | ICD-10-CM | POA: Diagnosis not present

## 2023-02-14 DIAGNOSIS — G0490 Encephalitis and encephalomyelitis, unspecified: Secondary | ICD-10-CM | POA: Diagnosis not present

## 2023-02-14 DIAGNOSIS — N170 Acute kidney failure with tubular necrosis: Secondary | ICD-10-CM | POA: Diagnosis not present

## 2023-02-14 NOTE — Telephone Encounter (Signed)
Opened in error

## 2023-02-15 DIAGNOSIS — N170 Acute kidney failure with tubular necrosis: Secondary | ICD-10-CM | POA: Diagnosis not present

## 2023-02-15 DIAGNOSIS — G0490 Encephalitis and encephalomyelitis, unspecified: Secondary | ICD-10-CM | POA: Diagnosis not present

## 2023-02-15 DIAGNOSIS — A414 Sepsis due to anaerobes: Secondary | ICD-10-CM | POA: Diagnosis not present

## 2023-02-16 DIAGNOSIS — A414 Sepsis due to anaerobes: Secondary | ICD-10-CM | POA: Diagnosis not present

## 2023-02-16 DIAGNOSIS — G0490 Encephalitis and encephalomyelitis, unspecified: Secondary | ICD-10-CM | POA: Diagnosis not present

## 2023-02-16 DIAGNOSIS — N170 Acute kidney failure with tubular necrosis: Secondary | ICD-10-CM | POA: Diagnosis not present

## 2023-02-17 DIAGNOSIS — G0490 Encephalitis and encephalomyelitis, unspecified: Secondary | ICD-10-CM | POA: Diagnosis not present

## 2023-02-17 DIAGNOSIS — N170 Acute kidney failure with tubular necrosis: Secondary | ICD-10-CM | POA: Diagnosis not present

## 2023-02-17 DIAGNOSIS — A414 Sepsis due to anaerobes: Secondary | ICD-10-CM | POA: Diagnosis not present

## 2023-02-18 DIAGNOSIS — N170 Acute kidney failure with tubular necrosis: Secondary | ICD-10-CM | POA: Diagnosis not present

## 2023-02-18 DIAGNOSIS — G0490 Encephalitis and encephalomyelitis, unspecified: Secondary | ICD-10-CM | POA: Diagnosis not present

## 2023-02-18 DIAGNOSIS — A414 Sepsis due to anaerobes: Secondary | ICD-10-CM | POA: Diagnosis not present

## 2023-02-19 DIAGNOSIS — A414 Sepsis due to anaerobes: Secondary | ICD-10-CM | POA: Diagnosis not present

## 2023-02-19 DIAGNOSIS — G0490 Encephalitis and encephalomyelitis, unspecified: Secondary | ICD-10-CM | POA: Diagnosis not present

## 2023-02-19 DIAGNOSIS — N170 Acute kidney failure with tubular necrosis: Secondary | ICD-10-CM | POA: Diagnosis not present

## 2023-02-20 DIAGNOSIS — G0490 Encephalitis and encephalomyelitis, unspecified: Secondary | ICD-10-CM | POA: Diagnosis not present

## 2023-02-20 DIAGNOSIS — N170 Acute kidney failure with tubular necrosis: Secondary | ICD-10-CM | POA: Diagnosis not present

## 2023-02-20 DIAGNOSIS — A414 Sepsis due to anaerobes: Secondary | ICD-10-CM | POA: Diagnosis not present

## 2023-02-21 DIAGNOSIS — N170 Acute kidney failure with tubular necrosis: Secondary | ICD-10-CM | POA: Diagnosis not present

## 2023-02-21 DIAGNOSIS — G0490 Encephalitis and encephalomyelitis, unspecified: Secondary | ICD-10-CM | POA: Diagnosis not present

## 2023-02-21 DIAGNOSIS — A414 Sepsis due to anaerobes: Secondary | ICD-10-CM | POA: Diagnosis not present

## 2023-02-23 DIAGNOSIS — N179 Acute kidney failure, unspecified: Secondary | ICD-10-CM | POA: Diagnosis not present

## 2023-02-23 DIAGNOSIS — R109 Unspecified abdominal pain: Secondary | ICD-10-CM | POA: Diagnosis not present

## 2023-02-23 DIAGNOSIS — R739 Hyperglycemia, unspecified: Secondary | ICD-10-CM | POA: Diagnosis not present

## 2023-02-23 DIAGNOSIS — M549 Dorsalgia, unspecified: Secondary | ICD-10-CM | POA: Diagnosis not present

## 2023-02-25 DIAGNOSIS — G039 Meningitis, unspecified: Secondary | ICD-10-CM | POA: Diagnosis not present

## 2023-02-25 DIAGNOSIS — I4891 Unspecified atrial fibrillation: Secondary | ICD-10-CM | POA: Diagnosis not present

## 2023-02-25 DIAGNOSIS — Z7901 Long term (current) use of anticoagulants: Secondary | ICD-10-CM | POA: Diagnosis not present

## 2023-02-25 DIAGNOSIS — G9341 Metabolic encephalopathy: Secondary | ICD-10-CM | POA: Diagnosis not present

## 2023-02-25 DIAGNOSIS — G8929 Other chronic pain: Secondary | ICD-10-CM | POA: Diagnosis not present

## 2023-02-25 DIAGNOSIS — Z792 Long term (current) use of antibiotics: Secondary | ICD-10-CM | POA: Diagnosis not present

## 2023-02-25 DIAGNOSIS — Z6841 Body Mass Index (BMI) 40.0 and over, adult: Secondary | ICD-10-CM | POA: Diagnosis not present

## 2023-02-25 DIAGNOSIS — R32 Unspecified urinary incontinence: Secondary | ICD-10-CM | POA: Diagnosis not present

## 2023-02-25 DIAGNOSIS — A414 Sepsis due to anaerobes: Secondary | ICD-10-CM | POA: Diagnosis not present

## 2023-02-25 DIAGNOSIS — N390 Urinary tract infection, site not specified: Secondary | ICD-10-CM | POA: Diagnosis not present

## 2023-02-25 DIAGNOSIS — M549 Dorsalgia, unspecified: Secondary | ICD-10-CM | POA: Diagnosis not present

## 2023-02-25 DIAGNOSIS — Z720 Tobacco use: Secondary | ICD-10-CM | POA: Diagnosis not present

## 2023-02-25 DIAGNOSIS — N170 Acute kidney failure with tubular necrosis: Secondary | ICD-10-CM | POA: Diagnosis not present

## 2023-02-25 DIAGNOSIS — I10 Essential (primary) hypertension: Secondary | ICD-10-CM | POA: Diagnosis not present

## 2023-02-25 DIAGNOSIS — E876 Hypokalemia: Secondary | ICD-10-CM | POA: Diagnosis not present

## 2023-03-18 DIAGNOSIS — A414 Sepsis due to anaerobes: Secondary | ICD-10-CM | POA: Diagnosis not present

## 2023-03-18 DIAGNOSIS — G9341 Metabolic encephalopathy: Secondary | ICD-10-CM | POA: Diagnosis not present

## 2023-03-18 DIAGNOSIS — Z792 Long term (current) use of antibiotics: Secondary | ICD-10-CM | POA: Diagnosis not present

## 2023-03-18 DIAGNOSIS — Z6841 Body Mass Index (BMI) 40.0 and over, adult: Secondary | ICD-10-CM | POA: Diagnosis not present

## 2023-03-18 DIAGNOSIS — G8929 Other chronic pain: Secondary | ICD-10-CM | POA: Diagnosis not present

## 2023-03-18 DIAGNOSIS — I10 Essential (primary) hypertension: Secondary | ICD-10-CM | POA: Diagnosis not present

## 2023-03-18 DIAGNOSIS — Z7901 Long term (current) use of anticoagulants: Secondary | ICD-10-CM | POA: Diagnosis not present

## 2023-03-18 DIAGNOSIS — E876 Hypokalemia: Secondary | ICD-10-CM | POA: Diagnosis not present

## 2023-03-18 DIAGNOSIS — Z720 Tobacco use: Secondary | ICD-10-CM | POA: Diagnosis not present

## 2023-03-18 DIAGNOSIS — N390 Urinary tract infection, site not specified: Secondary | ICD-10-CM | POA: Diagnosis not present

## 2023-03-18 DIAGNOSIS — G039 Meningitis, unspecified: Secondary | ICD-10-CM | POA: Diagnosis not present

## 2023-03-18 DIAGNOSIS — N170 Acute kidney failure with tubular necrosis: Secondary | ICD-10-CM | POA: Diagnosis not present

## 2023-03-18 DIAGNOSIS — R32 Unspecified urinary incontinence: Secondary | ICD-10-CM | POA: Diagnosis not present

## 2023-03-18 DIAGNOSIS — M549 Dorsalgia, unspecified: Secondary | ICD-10-CM | POA: Diagnosis not present

## 2023-03-18 DIAGNOSIS — I4891 Unspecified atrial fibrillation: Secondary | ICD-10-CM | POA: Diagnosis not present

## 2023-03-23 DIAGNOSIS — G039 Meningitis, unspecified: Secondary | ICD-10-CM | POA: Diagnosis not present

## 2023-03-23 DIAGNOSIS — G9341 Metabolic encephalopathy: Secondary | ICD-10-CM | POA: Diagnosis not present

## 2023-03-23 DIAGNOSIS — G8929 Other chronic pain: Secondary | ICD-10-CM | POA: Diagnosis not present

## 2023-03-23 DIAGNOSIS — I10 Essential (primary) hypertension: Secondary | ICD-10-CM | POA: Diagnosis not present

## 2023-03-23 DIAGNOSIS — Z7901 Long term (current) use of anticoagulants: Secondary | ICD-10-CM | POA: Diagnosis not present

## 2023-03-23 DIAGNOSIS — Z6841 Body Mass Index (BMI) 40.0 and over, adult: Secondary | ICD-10-CM | POA: Diagnosis not present

## 2023-03-23 DIAGNOSIS — N170 Acute kidney failure with tubular necrosis: Secondary | ICD-10-CM | POA: Diagnosis not present

## 2023-03-23 DIAGNOSIS — N390 Urinary tract infection, site not specified: Secondary | ICD-10-CM | POA: Diagnosis not present

## 2023-03-23 DIAGNOSIS — M549 Dorsalgia, unspecified: Secondary | ICD-10-CM | POA: Diagnosis not present

## 2023-03-23 DIAGNOSIS — A414 Sepsis due to anaerobes: Secondary | ICD-10-CM | POA: Diagnosis not present

## 2023-03-23 DIAGNOSIS — Z792 Long term (current) use of antibiotics: Secondary | ICD-10-CM | POA: Diagnosis not present

## 2023-03-23 DIAGNOSIS — E876 Hypokalemia: Secondary | ICD-10-CM | POA: Diagnosis not present

## 2023-03-23 DIAGNOSIS — R32 Unspecified urinary incontinence: Secondary | ICD-10-CM | POA: Diagnosis not present

## 2023-03-23 DIAGNOSIS — Z720 Tobacco use: Secondary | ICD-10-CM | POA: Diagnosis not present

## 2023-03-23 DIAGNOSIS — I4891 Unspecified atrial fibrillation: Secondary | ICD-10-CM | POA: Diagnosis not present

## 2023-04-01 DIAGNOSIS — G039 Meningitis, unspecified: Secondary | ICD-10-CM | POA: Diagnosis not present

## 2023-04-01 DIAGNOSIS — N170 Acute kidney failure with tubular necrosis: Secondary | ICD-10-CM | POA: Diagnosis not present

## 2023-04-01 DIAGNOSIS — G9341 Metabolic encephalopathy: Secondary | ICD-10-CM | POA: Diagnosis not present

## 2023-04-01 DIAGNOSIS — Z6841 Body Mass Index (BMI) 40.0 and over, adult: Secondary | ICD-10-CM | POA: Diagnosis not present

## 2023-04-01 DIAGNOSIS — Z792 Long term (current) use of antibiotics: Secondary | ICD-10-CM | POA: Diagnosis not present

## 2023-04-01 DIAGNOSIS — A414 Sepsis due to anaerobes: Secondary | ICD-10-CM | POA: Diagnosis not present

## 2023-04-01 DIAGNOSIS — R32 Unspecified urinary incontinence: Secondary | ICD-10-CM | POA: Diagnosis not present

## 2023-04-01 DIAGNOSIS — Z7901 Long term (current) use of anticoagulants: Secondary | ICD-10-CM | POA: Diagnosis not present

## 2023-04-01 DIAGNOSIS — I10 Essential (primary) hypertension: Secondary | ICD-10-CM | POA: Diagnosis not present

## 2023-04-01 DIAGNOSIS — E876 Hypokalemia: Secondary | ICD-10-CM | POA: Diagnosis not present

## 2023-04-01 DIAGNOSIS — I4891 Unspecified atrial fibrillation: Secondary | ICD-10-CM | POA: Diagnosis not present

## 2023-04-01 DIAGNOSIS — N390 Urinary tract infection, site not specified: Secondary | ICD-10-CM | POA: Diagnosis not present

## 2023-04-01 DIAGNOSIS — Z720 Tobacco use: Secondary | ICD-10-CM | POA: Diagnosis not present

## 2023-04-01 DIAGNOSIS — M549 Dorsalgia, unspecified: Secondary | ICD-10-CM | POA: Diagnosis not present

## 2023-04-01 DIAGNOSIS — G8929 Other chronic pain: Secondary | ICD-10-CM | POA: Diagnosis not present

## 2023-04-13 DIAGNOSIS — N170 Acute kidney failure with tubular necrosis: Secondary | ICD-10-CM | POA: Diagnosis not present

## 2023-04-13 DIAGNOSIS — I10 Essential (primary) hypertension: Secondary | ICD-10-CM | POA: Diagnosis not present

## 2023-04-13 DIAGNOSIS — G9341 Metabolic encephalopathy: Secondary | ICD-10-CM | POA: Diagnosis not present

## 2023-04-13 DIAGNOSIS — G039 Meningitis, unspecified: Secondary | ICD-10-CM | POA: Diagnosis not present

## 2023-04-13 DIAGNOSIS — E876 Hypokalemia: Secondary | ICD-10-CM | POA: Diagnosis not present

## 2023-04-13 DIAGNOSIS — Z720 Tobacco use: Secondary | ICD-10-CM | POA: Diagnosis not present

## 2023-04-13 DIAGNOSIS — I4891 Unspecified atrial fibrillation: Secondary | ICD-10-CM | POA: Diagnosis not present

## 2023-04-13 DIAGNOSIS — G8929 Other chronic pain: Secondary | ICD-10-CM | POA: Diagnosis not present

## 2023-04-13 DIAGNOSIS — A414 Sepsis due to anaerobes: Secondary | ICD-10-CM | POA: Diagnosis not present

## 2023-04-13 DIAGNOSIS — N390 Urinary tract infection, site not specified: Secondary | ICD-10-CM | POA: Diagnosis not present

## 2023-04-13 DIAGNOSIS — R32 Unspecified urinary incontinence: Secondary | ICD-10-CM | POA: Diagnosis not present

## 2023-04-13 DIAGNOSIS — Z7901 Long term (current) use of anticoagulants: Secondary | ICD-10-CM | POA: Diagnosis not present

## 2023-04-13 DIAGNOSIS — Z792 Long term (current) use of antibiotics: Secondary | ICD-10-CM | POA: Diagnosis not present

## 2023-04-13 DIAGNOSIS — M549 Dorsalgia, unspecified: Secondary | ICD-10-CM | POA: Diagnosis not present

## 2023-04-13 DIAGNOSIS — Z6841 Body Mass Index (BMI) 40.0 and over, adult: Secondary | ICD-10-CM | POA: Diagnosis not present

## 2023-04-15 DIAGNOSIS — Z0000 Encounter for general adult medical examination without abnormal findings: Secondary | ICD-10-CM | POA: Diagnosis not present

## 2023-04-15 DIAGNOSIS — Z6841 Body Mass Index (BMI) 40.0 and over, adult: Secondary | ICD-10-CM | POA: Diagnosis not present

## 2023-04-20 DIAGNOSIS — G039 Meningitis, unspecified: Secondary | ICD-10-CM | POA: Diagnosis not present

## 2023-04-20 DIAGNOSIS — E876 Hypokalemia: Secondary | ICD-10-CM | POA: Diagnosis not present

## 2023-04-20 DIAGNOSIS — Z7901 Long term (current) use of anticoagulants: Secondary | ICD-10-CM | POA: Diagnosis not present

## 2023-04-20 DIAGNOSIS — Z792 Long term (current) use of antibiotics: Secondary | ICD-10-CM | POA: Diagnosis not present

## 2023-04-20 DIAGNOSIS — A414 Sepsis due to anaerobes: Secondary | ICD-10-CM | POA: Diagnosis not present

## 2023-04-20 DIAGNOSIS — G8929 Other chronic pain: Secondary | ICD-10-CM | POA: Diagnosis not present

## 2023-04-20 DIAGNOSIS — M549 Dorsalgia, unspecified: Secondary | ICD-10-CM | POA: Diagnosis not present

## 2023-04-20 DIAGNOSIS — R32 Unspecified urinary incontinence: Secondary | ICD-10-CM | POA: Diagnosis not present

## 2023-04-20 DIAGNOSIS — Z6841 Body Mass Index (BMI) 40.0 and over, adult: Secondary | ICD-10-CM | POA: Diagnosis not present

## 2023-04-20 DIAGNOSIS — I4891 Unspecified atrial fibrillation: Secondary | ICD-10-CM | POA: Diagnosis not present

## 2023-04-20 DIAGNOSIS — N390 Urinary tract infection, site not specified: Secondary | ICD-10-CM | POA: Diagnosis not present

## 2023-04-20 DIAGNOSIS — I10 Essential (primary) hypertension: Secondary | ICD-10-CM | POA: Diagnosis not present

## 2023-04-20 DIAGNOSIS — G9341 Metabolic encephalopathy: Secondary | ICD-10-CM | POA: Diagnosis not present

## 2023-04-20 DIAGNOSIS — Z720 Tobacco use: Secondary | ICD-10-CM | POA: Diagnosis not present

## 2023-04-20 DIAGNOSIS — N170 Acute kidney failure with tubular necrosis: Secondary | ICD-10-CM | POA: Diagnosis not present

## 2023-04-27 DIAGNOSIS — Z1211 Encounter for screening for malignant neoplasm of colon: Secondary | ICD-10-CM | POA: Diagnosis not present

## 2023-05-11 DIAGNOSIS — Z1211 Encounter for screening for malignant neoplasm of colon: Secondary | ICD-10-CM | POA: Diagnosis not present

## 2023-05-11 DIAGNOSIS — Z7982 Long term (current) use of aspirin: Secondary | ICD-10-CM | POA: Diagnosis not present

## 2023-05-11 DIAGNOSIS — Z7901 Long term (current) use of anticoagulants: Secondary | ICD-10-CM | POA: Diagnosis not present

## 2023-05-11 DIAGNOSIS — Z79899 Other long term (current) drug therapy: Secondary | ICD-10-CM | POA: Diagnosis not present

## 2023-05-11 DIAGNOSIS — Z87891 Personal history of nicotine dependence: Secondary | ICD-10-CM | POA: Diagnosis not present

## 2023-05-11 DIAGNOSIS — Z888 Allergy status to other drugs, medicaments and biological substances status: Secondary | ICD-10-CM | POA: Diagnosis not present

## 2023-05-11 DIAGNOSIS — Z881 Allergy status to other antibiotic agents status: Secondary | ICD-10-CM | POA: Diagnosis not present

## 2023-05-11 DIAGNOSIS — Z880 Allergy status to penicillin: Secondary | ICD-10-CM | POA: Diagnosis not present

## 2023-06-08 DIAGNOSIS — F17200 Nicotine dependence, unspecified, uncomplicated: Secondary | ICD-10-CM | POA: Diagnosis not present

## 2023-06-08 DIAGNOSIS — Z122 Encounter for screening for malignant neoplasm of respiratory organs: Secondary | ICD-10-CM | POA: Diagnosis not present

## 2023-06-08 DIAGNOSIS — Z87891 Personal history of nicotine dependence: Secondary | ICD-10-CM | POA: Diagnosis not present

## 2023-06-08 DIAGNOSIS — F17210 Nicotine dependence, cigarettes, uncomplicated: Secondary | ICD-10-CM | POA: Diagnosis not present

## 2023-06-10 DIAGNOSIS — Z712 Person consulting for explanation of examination or test findings: Secondary | ICD-10-CM | POA: Diagnosis not present

## 2023-06-14 DIAGNOSIS — G894 Chronic pain syndrome: Secondary | ICD-10-CM | POA: Diagnosis not present

## 2023-06-14 DIAGNOSIS — M4316 Spondylolisthesis, lumbar region: Secondary | ICD-10-CM | POA: Diagnosis not present

## 2023-06-14 DIAGNOSIS — M48062 Spinal stenosis, lumbar region with neurogenic claudication: Secondary | ICD-10-CM | POA: Diagnosis not present

## 2023-06-14 DIAGNOSIS — Z79899 Other long term (current) drug therapy: Secondary | ICD-10-CM | POA: Diagnosis not present

## 2023-06-14 DIAGNOSIS — M5416 Radiculopathy, lumbar region: Secondary | ICD-10-CM | POA: Diagnosis not present

## 2023-06-18 DIAGNOSIS — R87610 Atypical squamous cells of undetermined significance on cytologic smear of cervix (ASC-US): Secondary | ICD-10-CM | POA: Diagnosis not present

## 2023-06-18 DIAGNOSIS — Z124 Encounter for screening for malignant neoplasm of cervix: Secondary | ICD-10-CM | POA: Diagnosis not present

## 2023-06-21 DIAGNOSIS — R87610 Atypical squamous cells of undetermined significance on cytologic smear of cervix (ASC-US): Secondary | ICD-10-CM | POA: Diagnosis not present

## 2023-06-21 DIAGNOSIS — Z124 Encounter for screening for malignant neoplasm of cervix: Secondary | ICD-10-CM | POA: Diagnosis not present

## 2023-06-21 DIAGNOSIS — Z1151 Encounter for screening for human papillomavirus (HPV): Secondary | ICD-10-CM | POA: Diagnosis not present

## 2023-06-28 DIAGNOSIS — Z1231 Encounter for screening mammogram for malignant neoplasm of breast: Secondary | ICD-10-CM | POA: Diagnosis not present

## 2023-07-27 DIAGNOSIS — E538 Deficiency of other specified B group vitamins: Secondary | ICD-10-CM | POA: Diagnosis not present

## 2023-07-27 DIAGNOSIS — M48062 Spinal stenosis, lumbar region with neurogenic claudication: Secondary | ICD-10-CM | POA: Diagnosis not present

## 2023-07-27 DIAGNOSIS — I10 Essential (primary) hypertension: Secondary | ICD-10-CM | POA: Diagnosis not present

## 2023-07-27 DIAGNOSIS — E782 Mixed hyperlipidemia: Secondary | ICD-10-CM | POA: Diagnosis not present

## 2023-07-27 DIAGNOSIS — F341 Dysthymic disorder: Secondary | ICD-10-CM | POA: Diagnosis not present

## 2023-07-27 DIAGNOSIS — M4316 Spondylolisthesis, lumbar region: Secondary | ICD-10-CM | POA: Diagnosis not present

## 2023-07-27 DIAGNOSIS — M5416 Radiculopathy, lumbar region: Secondary | ICD-10-CM | POA: Diagnosis not present

## 2023-07-27 DIAGNOSIS — F17210 Nicotine dependence, cigarettes, uncomplicated: Secondary | ICD-10-CM | POA: Diagnosis not present

## 2023-07-28 DIAGNOSIS — G894 Chronic pain syndrome: Secondary | ICD-10-CM | POA: Diagnosis not present

## 2023-07-28 DIAGNOSIS — M4316 Spondylolisthesis, lumbar region: Secondary | ICD-10-CM | POA: Diagnosis not present

## 2023-07-28 DIAGNOSIS — M48062 Spinal stenosis, lumbar region with neurogenic claudication: Secondary | ICD-10-CM | POA: Diagnosis not present

## 2023-07-28 DIAGNOSIS — M5416 Radiculopathy, lumbar region: Secondary | ICD-10-CM | POA: Diagnosis not present

## 2023-07-28 DIAGNOSIS — Z79891 Long term (current) use of opiate analgesic: Secondary | ICD-10-CM | POA: Diagnosis not present
